# Patient Record
Sex: Female | Born: 1975
Health system: Southern US, Community
[De-identification: ages and names within clinical notes are randomized; demographics above are authoritative.]

## PROBLEM LIST (undated history)

## (undated) DIAGNOSIS — Z789 Other specified health status: Secondary | ICD-10-CM

## (undated) HISTORY — PX: CLOSED REDUCTION ULNAR SHAFT: SHX5009

---

## 1997-09-21 ENCOUNTER — Inpatient Hospital Stay (HOSPITAL_COMMUNITY): Admission: AD | Admit: 1997-09-21 | Discharge: 1997-09-21 | Payer: Self-pay | Admitting: *Deleted

## 1997-12-16 ENCOUNTER — Inpatient Hospital Stay (HOSPITAL_COMMUNITY): Admission: RE | Admit: 1997-12-16 | Discharge: 1997-12-16 | Payer: Self-pay | Admitting: Obstetrics

## 1997-12-20 ENCOUNTER — Inpatient Hospital Stay (HOSPITAL_COMMUNITY): Admission: AD | Admit: 1997-12-20 | Discharge: 1997-12-20 | Payer: Self-pay | Admitting: *Deleted

## 1997-12-23 ENCOUNTER — Encounter (HOSPITAL_COMMUNITY): Admission: RE | Admit: 1997-12-23 | Discharge: 1998-01-09 | Payer: Self-pay | Admitting: Obstetrics

## 1998-01-06 ENCOUNTER — Inpatient Hospital Stay (HOSPITAL_COMMUNITY): Admission: RE | Admit: 1998-01-06 | Discharge: 1998-01-09 | Payer: Self-pay | Admitting: *Deleted

## 1998-02-06 ENCOUNTER — Inpatient Hospital Stay (HOSPITAL_COMMUNITY): Admission: AD | Admit: 1998-02-06 | Discharge: 1998-02-06 | Payer: Self-pay | Admitting: Obstetrics & Gynecology

## 1998-06-01 ENCOUNTER — Encounter: Payer: Self-pay | Admitting: Emergency Medicine

## 1998-06-01 ENCOUNTER — Emergency Department (HOSPITAL_COMMUNITY): Admission: EM | Admit: 1998-06-01 | Discharge: 1998-06-01 | Payer: Self-pay | Admitting: Emergency Medicine

## 1999-07-29 ENCOUNTER — Inpatient Hospital Stay (HOSPITAL_COMMUNITY): Admission: AD | Admit: 1999-07-29 | Discharge: 1999-07-29 | Payer: Self-pay | Admitting: Obstetrics

## 1999-12-17 ENCOUNTER — Inpatient Hospital Stay (HOSPITAL_COMMUNITY): Admission: AD | Admit: 1999-12-17 | Discharge: 1999-12-17 | Payer: Self-pay | Admitting: Obstetrics

## 1999-12-17 ENCOUNTER — Encounter: Payer: Self-pay | Admitting: Obstetrics

## 2000-01-09 ENCOUNTER — Inpatient Hospital Stay (HOSPITAL_COMMUNITY): Admission: AD | Admit: 2000-01-09 | Discharge: 2000-01-11 | Payer: Self-pay | Admitting: Obstetrics

## 2000-01-24 ENCOUNTER — Inpatient Hospital Stay (HOSPITAL_COMMUNITY): Admission: AD | Admit: 2000-01-24 | Discharge: 2000-01-24 | Payer: Self-pay | Admitting: Obstetrics & Gynecology

## 2001-03-30 ENCOUNTER — Encounter: Payer: Self-pay | Admitting: Emergency Medicine

## 2001-03-30 ENCOUNTER — Emergency Department (HOSPITAL_COMMUNITY): Admission: EM | Admit: 2001-03-30 | Discharge: 2001-03-31 | Payer: Self-pay | Admitting: Emergency Medicine

## 2001-06-06 ENCOUNTER — Encounter: Payer: Self-pay | Admitting: *Deleted

## 2001-06-06 ENCOUNTER — Inpatient Hospital Stay (HOSPITAL_COMMUNITY): Admission: AD | Admit: 2001-06-06 | Discharge: 2001-06-06 | Payer: Self-pay | Admitting: *Deleted

## 2001-06-25 ENCOUNTER — Inpatient Hospital Stay (HOSPITAL_COMMUNITY): Admission: AD | Admit: 2001-06-25 | Discharge: 2001-07-01 | Payer: Self-pay | Admitting: Obstetrics & Gynecology

## 2001-06-25 ENCOUNTER — Encounter: Payer: Self-pay | Admitting: Obstetrics & Gynecology

## 2001-07-06 ENCOUNTER — Encounter (INDEPENDENT_AMBULATORY_CARE_PROVIDER_SITE_OTHER): Payer: Self-pay

## 2001-07-06 ENCOUNTER — Encounter: Payer: Self-pay | Admitting: Obstetrics & Gynecology

## 2001-07-06 ENCOUNTER — Inpatient Hospital Stay (HOSPITAL_COMMUNITY): Admission: AD | Admit: 2001-07-06 | Discharge: 2001-07-08 | Payer: Self-pay | Admitting: Obstetrics & Gynecology

## 2001-11-09 ENCOUNTER — Inpatient Hospital Stay (HOSPITAL_COMMUNITY): Admission: AD | Admit: 2001-11-09 | Discharge: 2001-11-09 | Payer: Self-pay | Admitting: Obstetrics and Gynecology

## 2001-12-29 ENCOUNTER — Emergency Department (HOSPITAL_COMMUNITY): Admission: EM | Admit: 2001-12-29 | Discharge: 2001-12-29 | Payer: Self-pay | Admitting: Emergency Medicine

## 2002-03-02 ENCOUNTER — Emergency Department (HOSPITAL_COMMUNITY): Admission: EM | Admit: 2002-03-02 | Discharge: 2002-03-03 | Payer: Self-pay | Admitting: Emergency Medicine

## 2002-03-16 ENCOUNTER — Encounter (INDEPENDENT_AMBULATORY_CARE_PROVIDER_SITE_OTHER): Payer: Self-pay | Admitting: Specialist

## 2002-03-16 ENCOUNTER — Encounter: Admission: RE | Admit: 2002-03-16 | Discharge: 2002-03-16 | Payer: Self-pay | Admitting: Internal Medicine

## 2002-03-25 ENCOUNTER — Ambulatory Visit (HOSPITAL_COMMUNITY): Admission: RE | Admit: 2002-03-25 | Discharge: 2002-03-25 | Payer: Self-pay | Admitting: Internal Medicine

## 2002-04-20 ENCOUNTER — Encounter (INDEPENDENT_AMBULATORY_CARE_PROVIDER_SITE_OTHER): Payer: Self-pay | Admitting: *Deleted

## 2002-04-20 ENCOUNTER — Other Ambulatory Visit: Admission: RE | Admit: 2002-04-20 | Discharge: 2002-04-20 | Payer: Self-pay | Admitting: *Deleted

## 2002-04-20 ENCOUNTER — Encounter: Admission: RE | Admit: 2002-04-20 | Discharge: 2002-04-20 | Payer: Self-pay | Admitting: *Deleted

## 2002-04-24 ENCOUNTER — Emergency Department (HOSPITAL_COMMUNITY): Admission: EM | Admit: 2002-04-24 | Discharge: 2002-04-24 | Payer: Self-pay | Admitting: Emergency Medicine

## 2002-05-04 ENCOUNTER — Encounter: Admission: RE | Admit: 2002-05-04 | Discharge: 2002-05-04 | Payer: Self-pay | Admitting: *Deleted

## 2003-11-14 ENCOUNTER — Emergency Department (HOSPITAL_COMMUNITY): Admission: EM | Admit: 2003-11-14 | Discharge: 2003-11-14 | Payer: Self-pay | Admitting: Emergency Medicine

## 2004-02-21 ENCOUNTER — Encounter: Admission: RE | Admit: 2004-02-21 | Discharge: 2004-02-21 | Payer: Self-pay | Admitting: Family Medicine

## 2007-04-05 ENCOUNTER — Emergency Department (HOSPITAL_COMMUNITY): Admission: EM | Admit: 2007-04-05 | Discharge: 2007-04-05 | Payer: Self-pay | Admitting: Family Medicine

## 2007-05-15 ENCOUNTER — Ambulatory Visit: Payer: Self-pay | Admitting: Internal Medicine

## 2007-10-01 ENCOUNTER — Encounter: Payer: Self-pay | Admitting: Obstetrics & Gynecology

## 2007-10-01 ENCOUNTER — Inpatient Hospital Stay (HOSPITAL_COMMUNITY): Admission: AD | Admit: 2007-10-01 | Discharge: 2007-10-03 | Payer: Self-pay | Admitting: Obstetrics & Gynecology

## 2007-10-01 ENCOUNTER — Ambulatory Visit: Payer: Self-pay | Admitting: *Deleted

## 2009-02-25 ENCOUNTER — Inpatient Hospital Stay (HOSPITAL_COMMUNITY): Admission: AD | Admit: 2009-02-25 | Discharge: 2009-02-26 | Payer: Self-pay | Admitting: Obstetrics & Gynecology

## 2009-02-25 ENCOUNTER — Ambulatory Visit: Payer: Self-pay | Admitting: Obstetrics and Gynecology

## 2009-03-12 ENCOUNTER — Ambulatory Visit: Payer: Self-pay | Admitting: Obstetrics and Gynecology

## 2009-03-12 ENCOUNTER — Inpatient Hospital Stay (HOSPITAL_COMMUNITY): Admission: AD | Admit: 2009-03-12 | Discharge: 2009-03-14 | Payer: Self-pay | Admitting: Obstetrics and Gynecology

## 2010-02-16 ENCOUNTER — Emergency Department (HOSPITAL_COMMUNITY): Admission: EM | Admit: 2010-02-16 | Discharge: 2010-02-16 | Payer: Self-pay | Admitting: Emergency Medicine

## 2010-09-14 LAB — DIFFERENTIAL
Basophils Relative: 0 % (ref 0–1)
Eosinophils Absolute: 0.1 10*3/uL (ref 0.0–0.7)
Lymphocytes Relative: 14 % (ref 12–46)
Monocytes Absolute: 1.1 10*3/uL — ABNORMAL HIGH (ref 0.1–1.0)
Neutrophils Relative %: 77 % (ref 43–77)

## 2010-09-14 LAB — CBC
HCT: 32.1 % — ABNORMAL LOW (ref 36.0–46.0)
Hemoglobin: 10.4 g/dL — ABNORMAL LOW (ref 12.0–15.0)
MCHC: 33.8 g/dL (ref 30.0–36.0)
MCV: 98.7 fL (ref 78.0–100.0)
Platelets: 191 10*3/uL (ref 150–400)
Platelets: 201 10*3/uL (ref 150–400)
RBC: 3.28 MIL/uL — ABNORMAL LOW (ref 3.87–5.11)
WBC: 14.3 10*3/uL — ABNORMAL HIGH (ref 4.0–10.5)
WBC: 8.7 10*3/uL (ref 4.0–10.5)

## 2010-09-14 LAB — RPR: RPR Ser Ql: NONREACTIVE

## 2010-09-14 LAB — COMPREHENSIVE METABOLIC PANEL
BUN: 6 mg/dL (ref 6–23)
Creatinine, Ser: 0.54 mg/dL (ref 0.4–1.2)
GFR calc Af Amer: >60 mL/min (ref >60)
Sodium: 134 mEq/L — ABNORMAL LOW (ref 135–145)
Total Bilirubin: 0.5 mg/dL (ref 0.3–1.2)

## 2010-09-14 LAB — GC/CHLAMYDIA PROBE AMP, GENITAL: GC Probe Amp, Genital: NEGATIVE

## 2010-09-14 LAB — RAPID URINE DRUG SCREEN, HOSP PERFORMED
Amphetamines: NOT DETECTED
Cocaine: NOT DETECTED
Cocaine: NOT DETECTED
Opiates: NOT DETECTED
Opiates: NOT DETECTED
Tetrahydrocannabinol: POSITIVE — AB

## 2010-09-14 LAB — TYPE AND SCREEN
ABO/RH(D): B POS
Antibody Screen: NEGATIVE

## 2010-09-14 LAB — STREP B DNA PROBE: Strep Group B Ag: NEGATIVE

## 2010-09-14 LAB — RUBELLA SCREEN: Rubella: 45.4 IU/mL — ABNORMAL HIGH

## 2010-09-14 LAB — WET PREP, GENITAL

## 2010-09-14 LAB — HEPATITIS B SURFACE ANTIGEN: Hepatitis B Surface Ag: NEGATIVE

## 2010-09-14 LAB — RAPID HIV SCREEN (WH-MAU): Rapid HIV Screen: NONREACTIVE

## 2010-10-26 NOTE — Discharge Summary (Signed)
[facility]  Patient:    Connie Martin, Connie Martin Visit Number: [id number] MRN: [id number]          Service Type: MED Location: [phone] Attending Physician:  Antionette Char Dictated by:   Ocie Doyne, M.D. Admit Date:  07/06/2001 Discharge Date: 07/08/2001                             Discharge Summary  DISCHARGE DIAGNOSES:          1. Cervicitis.                               2. Spontaneous abortion at [redacted] weeks gestation of                                  twin pregnancy.  DISCHARGE MEDICATIONS:        1. Tylenol 650 mg p.o. q.6h. p.r.n. pain.                               2. Ibuprofen 600 mg p.o. q.6h. p.r.n. pain.  HISTORY AND PHYSICAL:         This 35 year old G7, P3-1-2-4 at 52 2/[redacted] weeks gestation presented via EMS to maternity admissions.  She stated that her contractions had started five hours prior to admission and that she had bleeding from the vagina.  She had already delivered one fetus at home that morning and was continuing to have cramping.  She rated her pain an 8/10 on severity.  She had recently been treated at [facility] for cervicitis and received a week of IV antibiotics earlier in the month because of persistent unexplained vaginal bleeding.  On examination her vital signs were stable.  The fetal heart rate of the surviving twin was dopplered at 180. Generally, she was tearful.  The abdomen was soft and nontender.  Twin A remained attached by the umbilical cord which was clamped and cut and twin A was sent to pathology for microscopic examination.  Sterile vaginal examination revealed cervix 4 cm dilated.  A review of the patients medical records revealed that an ultrasound on June 25, 2001 showed a twin gestation with separate amniotic sacs.  However, a bed side ultrasound prior to admission revealed that there was no fluid surrounding twin B.  HOSPITAL COURSE:              Connie Martin was admitted to the birthing  suite and immediately started on Unasyn IV for the treatment of presumptive cervicitis. The hospital chaplain was involved to provide emotional support to the family and a detailed discussion was held with Connie Martin and her husband involving the likely poor outcome for twin B given that the membranes appeared to have ruptured.  On hospital day #2 the patient requested an ultrasound to evaluate fetal cardiac activity which again confirmed that there was no amniotic fluid. At that point the patient stated that given the likelihood of a poor prognosis she preferred to proceed with augmentation of labor using Pitocin.  She delivered twin B later that evening.  The products of conception appeared intact.  The amniotic fluid in the sac was yellow and malodorous with a purulent appearance.  She was continued on Unasyn for an additional 24 hours.  The following day she had scant lochia with mild fundal tenderness and her maximum temperature was 99.8.  She was observed for 24 hours following delivery and then was discharged home.  At the time of discharge she was requesting to have a tubal ligation for permanent sterilization.  Referrals were made for grief counseling and she was given contact information for the GYN Clinic at [facility] to pursue permanent sterilization in the future if she so desires. Dictated by:   Ocie Doyne, M.D. Attending Physician:  Antionette Char DD:  08/05/01 TD:  08/05/01 Job: [id number]

## 2010-10-26 NOTE — Discharge Summary (Signed)
[facility]  Patient:    Connie Martin, Connie Martin Visit Number: [id number] MRN: [id number]          Service Type: MED Location: [phone] Attending Physician:  Antionette Char Dictated by:   Ed Blalock. Burnadette Peter, M.D. Admit Date:  06/25/2001 Discharge Date: 07/01/2001                             Discharge Summary  DATE OF BIRTH:                September 25, 1975  HISTORY OF PRESENT ILLNESS:   This 35 year old, G7, P3-1-2-4, presented with the chief complaint of abdominal cramping and vaginal bleeding which started the day prior to admission.  The patient had an unknown LMP, but ultrasound in maternity admissions unit showed a gestational age of [redacted] weeks 5 days, with twin gestation.  The patient had vaginal bleeding and yellow discharge with a bad odor and negative HPV diagnosis recently negative for GC and chlamydia.  MEDICATIONS:                  Prenatal vitamins.  ALLERGIES:                    No known drug allergies.  OBSTETRICAL HISTORY:          1. Spontaneous vaginal delivery x 4.                               2. History of PIH.                               3. History of PID with last pregnancy.  GYNECOLOGICAL HISTORY:        1. Complete AB x 2.                               2. History of HSV.                               3. GC and chlamydia.                               4. PID.                               5. Normal Pap at [facility] recently.  PAST MEDICAL HISTORY:         Denies any.  PAST SURGICAL HISTORY:        Surgery on right ear.  HOSPITALIZATIONS:             She had one with a right foot repair.  SOCIAL HISTORY:               She smokes two packs a day.  She denies alcohol use.  She uses marijuana intermittently on the weekends.  One partner ______ years.  No contact since her last visit to MAU.  PHYSICAL EXAMINATION:  VITAL SIGNS:                  Temperature 97.6, heart rate 96, respirations 20,  blood pressure  121/67.  GENERAL:                      Well-developed, well-nourished African-American female.  Comfortable, in no acute distress.  ABDOMEN:                      Soft.  Mildly tender.  PELVIC:                       Normal external genitalia with blood noted. Cervix was closed.  Adnexa soft and slightly tender bilaterally.  She had a small amount of odorous discharge.  Uterus was gravid.  There were fetal heart tones noted.  LABORATORY DATA:              Wet prep showed few epithelials, few wbcs, and moderate bacteria.  CBC showed hemoglobin and hematocrit of 11 and 31.6, white blood cell count 12.4.  Ultrasound showed twin gestation with a divided membrane, 15 weeks 5 days. Cervix was 3 cm, long.  HOSPITAL COURSE:              The patient was admitted for IV antibiotics and bed rest.  She continued to experience vaginal bleeding throughout the hospitalization.  After hospital day #4 this stabilized and began to decrease. On hospital day #5 it decreased significantly and changed to a pinkish discharge.  On the day of discharge she reported little or no discharge.  All of this occurred while the patient was on cephaloram 2 g IV q.12h. over a period of seven days.  During this time she had no other difficulties.  Had good normal heart tones with Doppler and was nontender in the abdomen.  DIET:                         Regular.  ACTIVITY:                     Bed rest.  DISCHARGE MEDICATIONS:        Prenatal vitamins 1 p.o. q.d.  FOLLOW-UP:                    She has an appointment at the high-risk clinic at 8:45 on July 09, 2001.  CONDITION ON DISCHARGE:       Improved.  DISCHARGE DIAGNOSES:          1. Intrauterine pregnancy with twin gestation at                                  16-4/7 weeks at discharge.                               2. Vaginal bleeding, resolved.                               3. Bacterial vaginosis, treated. Dictated by:   Ed Blalock. Burnadette Peter,  M.D. Attending Physician:  Antionette Char DD:  07/01/01 TD:  07/02/01 Job: [id number]

## 2010-10-26 NOTE — H&P (Signed)
[facility]  Patient:    Connie Martin, Connie Martin                      MRN: [id number] Adm. Date:  [id number] Disc. Date: [id number] Attending:  Antionette Char                         History and Physical  REASON FOR ADMISSION:         A 35 year old African-American female presented to [facility] with uterine contractions, no prenatal care.  By her last menstrual period, she had a due date of January 21, 2000.  The patient was having strong uterine contractions on admission through maternity admissions unit and precipitously delivered a viable infant at 64 on January 09, 2000 in the maternity admissions unit.  Apgars were 9 at one minute, 9 at five minutes.  The placenta was delivered spontaneously intact at 1334.  There were no lacerations observed.  PAST MEDICAL HISTORY:  SURGERY:                      Broken arm as a child.  ILLNESSES:                    None.  MEDICATIONS:                  None.  ALLERGIES:                    No known drug allergies.  SOCIAL HISTORY:               Single.  Smokes two packs of cigarettes per day.  Positive marijuana.  OBSTETRICAL HISTORY:          No prenatal care for this pregnancy.  Had a spontaneous abortion in 1994, normal spontaneous vaginal delivery in 1995, preterm delivery at 28 weeks in 1996, spontaneous abortion in 1999, normal spontaneous vaginal delivery in 1999.  GYNECOLOGICAL HISTORY:        Positive history of abnormal Pap smear. Negative history of sexually-transmitted diseases.  PHYSICAL EXAMINATION:  VITAL SIGNS:                  Afebrile, vital signs stable.  ABDOMEN:                      Gravid.  Uterus nontender.  PELVIC:                       Cervix fully dilated, 100% effaced, and the presenting part was vertex at a +2 to +3 station.  IMPRESSION:                   Active labor with impending precipitous vaginal delivery.  PLAN:                         Routine postpartum  care.  HOSPITAL COURSE:              Patient precipitously delivered a viable infant with Apgars of 9 at one minute and 9 at five minutes, and a weight of 2300 g. There were no intrapartum complications.  DISPOSITION:                  Will obtain routine prenatal labs, routine postpartum care, will get social service consultation because of the  history of no prenatal care and the history of substance abuse. DD:  02/20/00 TD:  02/20/00 Job: [id number]

## 2010-12-03 ENCOUNTER — Emergency Department (HOSPITAL_COMMUNITY)
Admission: EM | Admit: 2010-12-03 | Discharge: 2010-12-03 | Disposition: A | Discharge status: Home / Self Care | Payer: Medicaid Other | Attending: Emergency Medicine | Admitting: Emergency Medicine

## 2010-12-03 DIAGNOSIS — K089 Disorder of teeth and supporting structures, unspecified: Secondary | ICD-10-CM | POA: Insufficient documentation

## 2011-03-05 LAB — RPR: RPR Ser Ql: NONREACTIVE

## 2011-03-05 LAB — DIFFERENTIAL
Lymphocytes Relative: 20
Lymphs Abs: 2.3
Neutro Abs: 7.8 — ABNORMAL HIGH
Neutrophils Relative %: 70

## 2011-03-05 LAB — RAPID URINE DRUG SCREEN, HOSP PERFORMED
Barbiturates: NOT DETECTED
Cocaine: NOT DETECTED
Opiates: NOT DETECTED
Tetrahydrocannabinol: POSITIVE — AB

## 2011-03-05 LAB — CBC
RDW: 13.1
WBC: 11.1 — ABNORMAL HIGH

## 2011-03-05 LAB — RAPID HIV SCREEN (WH-MAU): Rapid HIV Screen: NONREACTIVE

## 2011-03-05 LAB — TYPE AND SCREEN

## 2012-03-09 ENCOUNTER — Encounter (HOSPITAL_COMMUNITY): Payer: Self-pay | Admitting: *Deleted

## 2012-03-09 ENCOUNTER — Emergency Department (HOSPITAL_COMMUNITY)
Admission: EM | Admit: 2012-03-09 | Discharge: 2012-03-09 | Disposition: A | Discharge status: Home / Self Care | Payer: Self-pay | Attending: Emergency Medicine | Admitting: Emergency Medicine

## 2012-03-09 DIAGNOSIS — R21 Rash and other nonspecific skin eruption: Secondary | ICD-10-CM | POA: Insufficient documentation

## 2012-03-09 DIAGNOSIS — F17200 Nicotine dependence, unspecified, uncomplicated: Secondary | ICD-10-CM | POA: Insufficient documentation

## 2012-03-09 LAB — POCT I-STAT, CHEM 8
Calcium, Ion: 1.19 mmol/L (ref 1.12–1.23)
Creatinine, Ser: 0.7 mg/dL (ref 0.50–1.10)
Glucose, Bld: 98 mg/dL (ref 70–99)
HCT: 33 % — ABNORMAL LOW (ref 36.0–46.0)
Hemoglobin: 11.2 g/dL — ABNORMAL LOW (ref 12.0–15.0)
TCO2: 23 mmol/L (ref 0–100)

## 2012-03-09 LAB — CBC WITH DIFFERENTIAL/PLATELET
Basophils Absolute: 0 10*3/uL (ref 0.0–0.1)
Basophils Relative: 0 % (ref 0–1)
Eosinophils Absolute: 0.1 10*3/uL (ref 0.0–0.7)
Eosinophils Relative: 1 % (ref 0–5)
HCT: 31.1 % — ABNORMAL LOW (ref 36.0–46.0)
Hemoglobin: 11 g/dL — ABNORMAL LOW (ref 12.0–15.0)
Lymphocytes Relative: 27 % (ref 12–46)
Lymphs Abs: 2.1 10*3/uL (ref 0.7–4.0)
MCH: 32.3 pg (ref 26.0–34.0)
MCHC: 35.4 g/dL (ref 30.0–36.0)
MCV: 91.2 fL (ref 78.0–100.0)
Monocytes Absolute: 0.7 10*3/uL (ref 0.1–1.0)
Monocytes Relative: 9 % (ref 3–12)
Neutro Abs: 5.1 10*3/uL (ref 1.7–7.7)
Neutrophils Relative %: 63 % (ref 43–77)
Platelets: 196 10*3/uL (ref 150–400)
RBC: 3.41 MIL/uL — ABNORMAL LOW (ref 3.87–5.11)
RDW: 12.3 % (ref 11.5–15.5)
WBC: 8 10*3/uL (ref 4.0–10.5)

## 2012-03-09 LAB — URINALYSIS, ROUTINE W REFLEX MICROSCOPIC
Bilirubin Urine: NEGATIVE
Glucose, UA: NEGATIVE mg/dL
Hgb urine dipstick: NEGATIVE
Ketones, ur: NEGATIVE mg/dL
Leukocytes, UA: NEGATIVE
Nitrite: NEGATIVE
Protein, ur: NEGATIVE mg/dL
Specific Gravity, Urine: 1.028 (ref 1.005–1.030)
Urobilinogen, UA: 1 mg/dL (ref 0.0–1.0)
pH: 6 (ref 5.0–8.0)

## 2012-03-09 LAB — POCT PREGNANCY, URINE: Preg Test, Ur: POSITIVE — AB

## 2012-03-09 NOTE — ED Notes (Signed)
PT to ED c/o butterly-wing shaped rash on her face and macular rash with her hair falling out x 1 week.  Pt states the last time she had the rash, she was pregnant, but this time is different, b/c she is breaking out all over.   Also c/o of recent fatigue.

## 2012-03-09 NOTE — ED Provider Notes (Signed)
History     CSN: [id number]  Arrival date & time 03/09/12  2010   First MD Initiated Contact with Patient 03/09/12 2139      Chief Complaint  Patient presents with  . Rash    (Consider location/radiation/quality/duration/timing/severity/associated sxs/prior treatment) HPI Comments: Pt had come in with a rash on her face and with a history of hair falling out for a week.  When I saw her she was very upset because she had had a pregnancy test that was positive.  She refused to tell me her history, and left without full exam and did not receive treatment.  Patient is a 36 y.o. female presenting with rash. The history is provided by the patient and a friend. No language interpreter was used.  Rash  This is a new problem. Episode onset: Appx 1 week, per triage note. Associated with: Hair falling out. The rash is present on the face. The pain is at a severity of 8/10. She has tried nothing for the symptoms.    History reviewed. No pertinent past medical history.  Past Surgical History  Procedure Date  . Closed reduction ulnar shaft     No family history on file.  History  Substance Use Topics  . Smoking status: Current Some Day Smoker -- 2.0 packs/day    Types: Cigarettes  . Smokeless tobacco: Not on file  . Alcohol Use: No    OB History    Grav Para Term Preterm Abortions TAB SAB Ect Mult Living                  Review of Systems  Unable to perform ROS Skin: Positive for rash.    Allergies  Review of patient's allergies indicates no known allergies.  Home Medications  No current outpatient prescriptions on file.  BP 129/90  Pulse 108  Temp 98.6 F (37 C) (Oral)  Resp 18  SpO2 99%  Physical Exam  Nursing note and vitals reviewed. Constitutional: She appears well-developed and well-nourished.       Upset and angry.    HENT:       Has hyperpigmented rash on both cheeks and bridge of nose.    ED Course  Procedures (including critical care time)  Labs  Reviewed  CBC WITH DIFFERENTIAL - Abnormal; Notable for the following:    RBC 3.41 (*)     Hemoglobin 11.0 (*)     HCT 31.1 (*)     All other components within normal limits  URINALYSIS, ROUTINE W REFLEX MICROSCOPIC - Abnormal; Notable for the following:    APPearance CLOUDY (*)     All other components within normal limits  POCT I-STAT, CHEM 8 - Abnormal; Notable for the following:    Potassium 2.9 (*)     Hemoglobin 11.2 (*)     HCT 33.0 (*)     All other components within normal limits  POCT PREGNANCY, URINE - Abnormal; Notable for the following:    Preg Test, Ur POSITIVE (*)     All other components within normal limits   DISP:  I told pt I would get her lab results and review them with her.  By the time I got back to the room, she had walked out.  Her facial rash made me think of lupus erythematosis.    1. Rash of face             Carleene Cooper III, MD 03/09/12 2201

## 2012-06-10 NOTE — L&D Delivery Note (Signed)
Connie Martin is a 37 y.o. [id number] at [redacted]w[redacted]d gestation by today's ultrasound with no prenatal care. She presented today with abdominal cramping and diarrhea and was found to be 5.5 cm dilated. FHTs were reassuring. She progressed to 10 cm in MAU despite minimal discomfort and irregular activity on tocometry. She was taken to L&D where she delivered shortly thereafter. NICU was called and was present for delivery.  Delivery Note At 1:59 PM a viable female was delivered via Vaginal, Spontaneous Delivery (Presentation: Left Occiput Anterior).  APGAR: 8, 9; weight 5 lb, 3.8 oz. Placenta status: Intact, Spontaneous.  Cord:  3 vessel, short.  Cord pH: n/a.  Complications: none  Anesthesia: Local  Episiotomy: None Lacerations: None Suture Repair: n/a Est. Blood Loss (mL): 200  Mom to postpartum.  Baby to nursery-stable.  Napoleon Form 07/05/2012, 2:23 PM

## 2012-06-10 NOTE — L&D Delivery Note (Signed)
I have reviewed chart and agree with the plan of care.  HARRAWAY-SMITH, Tabathia Knoche, M.D, FACOG    

## 2012-07-05 ENCOUNTER — Inpatient Hospital Stay (HOSPITAL_COMMUNITY)
Admission: AD | Admit: 2012-07-05 | Discharge: 2012-07-07 | DRG: 775 | Disposition: A | Discharge status: Home / Self Care | Payer: Medicaid Other | Source: Ambulatory Visit | Attending: Obstetrics & Gynecology | Admitting: Obstetrics & Gynecology

## 2012-07-05 ENCOUNTER — Encounter (HOSPITAL_COMMUNITY): Payer: Self-pay | Admitting: Obstetrics and Gynecology

## 2012-07-05 ENCOUNTER — Inpatient Hospital Stay (HOSPITAL_COMMUNITY): Payer: Medicaid Other

## 2012-07-05 DIAGNOSIS — O368190 Decreased fetal movements, unspecified trimester, not applicable or unspecified: Principal | ICD-10-CM | POA: Diagnosis present

## 2012-07-05 DIAGNOSIS — O0930 Supervision of pregnancy with insufficient antenatal care, unspecified trimester: Secondary | ICD-10-CM

## 2012-07-05 HISTORY — DX: Other specified health status: Z78.9

## 2012-07-05 LAB — GROUP B STREP BY PCR: Group B strep by PCR: INVALID — AB

## 2012-07-05 LAB — URINE MICROSCOPIC-ADD ON

## 2012-07-05 LAB — CBC WITH DIFFERENTIAL/PLATELET
Basophils Relative: 0 % (ref 0–1)
Eosinophils Absolute: 0 10*3/uL (ref 0.0–0.7)
HCT: 33.7 % — ABNORMAL LOW (ref 36.0–46.0)
Hemoglobin: 11.6 g/dL — ABNORMAL LOW (ref 12.0–15.0)
Lymphs Abs: 1.5 10*3/uL (ref 0.7–4.0)
MCH: 32.3 pg (ref 26.0–34.0)
MCHC: 34.4 g/dL (ref 30.0–36.0)
Monocytes Absolute: 0.9 10*3/uL (ref 0.1–1.0)
Monocytes Relative: 6 % (ref 3–12)
Neutro Abs: 11.9 10*3/uL — ABNORMAL HIGH (ref 1.7–7.7)
Neutrophils Relative %: 83 % — ABNORMAL HIGH (ref 43–77)
RBC: 3.59 MIL/uL — ABNORMAL LOW (ref 3.87–5.11)

## 2012-07-05 LAB — URINALYSIS, ROUTINE W REFLEX MICROSCOPIC
Bilirubin Urine: NEGATIVE
Glucose, UA: NEGATIVE mg/dL
Protein, ur: NEGATIVE mg/dL
Urobilinogen, UA: 0.2 mg/dL (ref 0.0–1.0)

## 2012-07-05 LAB — TYPE AND SCREEN: ABO/RH(D): B POS

## 2012-07-05 LAB — RAPID HIV SCREEN (WH-MAU): Rapid HIV Screen: NONREACTIVE

## 2012-07-05 LAB — AMNISURE RUPTURE OF MEMBRANE (ROM) NOT AT ARMC: Amnisure ROM: NEGATIVE

## 2012-07-05 MED ORDER — NALBUPHINE SYRINGE 5 MG/0.5 ML: 5.0000 mg | INJECTION | INTRAMUSCULAR | Status: DC | PRN

## 2012-07-05 MED ORDER — TETANUS-DIPHTH-ACELL PERTUSSIS 5-2.5-18.5 LF-MCG/0.5 IM SUSP
0.5000 mL | Freq: Once | INTRAMUSCULAR | Status: AC
  Administered 2012-07-06: 0.5 mL via INTRAMUSCULAR

## 2012-07-05 MED ORDER — PNEUMOCOCCAL VAC POLYVALENT 25 MCG/0.5ML IJ INJ
0.5000 mL | INJECTION | INTRAMUSCULAR | Status: AC
  Administered 2012-07-06: 0.5 mL via INTRAMUSCULAR
  Filled 2012-07-05: qty 0.5

## 2012-07-05 MED ORDER — LIDOCAINE HCL (PF) 1 % IJ SOLN
30.0000 mL | INTRAMUSCULAR | Status: DC | PRN
  Administered 2012-07-05: 30 mL via SUBCUTANEOUS

## 2012-07-05 MED ORDER — SENNOSIDES-DOCUSATE SODIUM 8.6-50 MG PO TABS
2.0000 | ORAL_TABLET | Freq: Every day | ORAL | Status: DC
  Administered 2012-07-06: 2 via ORAL

## 2012-07-05 MED ORDER — CITRIC ACID-SODIUM CITRATE 334-500 MG/5ML PO SOLN: 30.0000 mL | ORAL | Status: DC | PRN

## 2012-07-05 MED ORDER — DIPHENHYDRAMINE HCL 25 MG PO CAPS: 25.0000 mg | ORAL_CAPSULE | Freq: Four times a day (QID) | ORAL | Status: DC | PRN

## 2012-07-05 MED ORDER — WITCH HAZEL-GLYCERIN EX PADS: 1.0000 "application " | MEDICATED_PAD | CUTANEOUS | Status: DC | PRN

## 2012-07-05 MED ORDER — ONDANSETRON HCL 4 MG/2ML IJ SOLN: 4.0000 mg | Freq: Four times a day (QID) | INTRAMUSCULAR | Status: DC | PRN

## 2012-07-05 MED ORDER — SODIUM CHLORIDE 0.9 % IV SOLN
2.0000 g | Freq: Four times a day (QID) | INTRAVENOUS | Status: DC
  Filled 2012-07-05 (×3): qty 2000

## 2012-07-05 MED ORDER — OXYTOCIN BOLUS FROM INFUSION: 500.0000 mL | INTRAVENOUS | Status: DC

## 2012-07-05 MED ORDER — ACETAMINOPHEN 325 MG PO TABS: 650.0000 mg | ORAL_TABLET | ORAL | Status: DC | PRN

## 2012-07-05 MED ORDER — ONDANSETRON HCL 4 MG/2ML IJ SOLN: 4.0000 mg | INTRAMUSCULAR | Status: DC | PRN

## 2012-07-05 MED ORDER — LANOLIN HYDROUS EX OINT: TOPICAL_OINTMENT | CUTANEOUS | Status: DC | PRN

## 2012-07-05 MED ORDER — INFLUENZA VIRUS VACC SPLIT PF IM SUSP
0.5000 mL | INTRAMUSCULAR | Status: AC
  Administered 2012-07-06: 0.5 mL via INTRAMUSCULAR
  Filled 2012-07-05: qty 0.5

## 2012-07-05 MED ORDER — LIDOCAINE HCL (PF) 1 % IJ SOLN
INTRAMUSCULAR | Status: AC
  Filled 2012-07-05: qty 30

## 2012-07-05 MED ORDER — PRENATAL MULTIVITAMIN CH
1.0000 | ORAL_TABLET | Freq: Every day | ORAL | Status: DC
  Administered 2012-07-06 – 2012-07-07 (×2): 1 via ORAL
  Filled 2012-07-05 (×2): qty 1

## 2012-07-05 MED ORDER — FLEET ENEMA 7-19 GM/118ML RE ENEM: 1.0000 | ENEMA | RECTAL | Status: DC | PRN

## 2012-07-05 MED ORDER — LACTATED RINGERS IV SOLN: 500.0000 mL | INTRAVENOUS | Status: DC | PRN

## 2012-07-05 MED ORDER — HYDROXYZINE HCL 50 MG/ML IM SOLN: 50.0000 mg | Freq: Four times a day (QID) | INTRAMUSCULAR | Status: DC | PRN

## 2012-07-05 MED ORDER — NALBUPHINE SYRINGE 5 MG/0.5 ML
5.0000 mg | INJECTION | Freq: Once | INTRAMUSCULAR | Status: AC
  Administered 2012-07-05: 5 mg via INTRAMUSCULAR
  Filled 2012-07-05: qty 0.5

## 2012-07-05 MED ORDER — IBUPROFEN 600 MG PO TABS
600.0000 mg | ORAL_TABLET | Freq: Four times a day (QID) | ORAL | Status: DC | PRN
  Administered 2012-07-05: 600 mg via ORAL
  Filled 2012-07-05: qty 1

## 2012-07-05 MED ORDER — OXYCODONE-ACETAMINOPHEN 5-325 MG PO TABS
1.0000 | ORAL_TABLET | ORAL | Status: DC | PRN
  Administered 2012-07-05: 1 via ORAL
  Filled 2012-07-05: qty 1

## 2012-07-05 MED ORDER — LACTATED RINGERS IV SOLN
INTRAVENOUS | Status: DC
  Administered 2012-07-05 (×2): via INTRAVENOUS

## 2012-07-05 MED ORDER — OXYCODONE-ACETAMINOPHEN 5-325 MG PO TABS
1.0000 | ORAL_TABLET | ORAL | Status: DC | PRN
  Administered 2012-07-06 (×2): 1 via ORAL
  Filled 2012-07-05 (×2): qty 1

## 2012-07-05 MED ORDER — OXYTOCIN 40 UNITS IN LACTATED RINGERS INFUSION - SIMPLE MED: 62.5000 mL/h | INTRAVENOUS | Status: DC

## 2012-07-05 MED ORDER — HYDROXYZINE HCL 50 MG PO TABS: 50.0000 mg | ORAL_TABLET | Freq: Four times a day (QID) | ORAL | Status: DC | PRN

## 2012-07-05 MED ORDER — OXYTOCIN 40 UNITS IN LACTATED RINGERS INFUSION - SIMPLE MED
INTRAVENOUS | Status: AC
  Filled 2012-07-05: qty 1000

## 2012-07-05 MED ORDER — SIMETHICONE 80 MG PO CHEW: 80.0000 mg | CHEWABLE_TABLET | ORAL | Status: DC | PRN

## 2012-07-05 MED ORDER — DIBUCAINE 1 % RE OINT: 1.0000 "application " | TOPICAL_OINTMENT | RECTAL | Status: DC | PRN

## 2012-07-05 MED ORDER — IBUPROFEN 600 MG PO TABS
600.0000 mg | ORAL_TABLET | Freq: Four times a day (QID) | ORAL | Status: DC
  Administered 2012-07-06 – 2012-07-07 (×7): 600 mg via ORAL
  Filled 2012-07-05 (×7): qty 1

## 2012-07-05 MED ORDER — BENZOCAINE-MENTHOL 20-0.5 % EX AERO
1.0000 "application " | INHALATION_SPRAY | CUTANEOUS | Status: DC | PRN
  Filled 2012-07-05: qty 56

## 2012-07-05 MED ORDER — ZOLPIDEM TARTRATE 5 MG PO TABS: 5.0000 mg | ORAL_TABLET | Freq: Every evening | ORAL | Status: DC | PRN

## 2012-07-05 MED ORDER — ONDANSETRON HCL 4 MG PO TABS: 4.0000 mg | ORAL_TABLET | ORAL | Status: DC | PRN

## 2012-07-05 MED ORDER — ACETAMINOPHEN 500 MG PO TABS
1000.0000 mg | ORAL_TABLET | Freq: Once | ORAL | Status: AC
  Administered 2012-07-05: 1000 mg via ORAL

## 2012-07-05 MED ORDER — ONDANSETRON 4 MG PO TBDP
4.0000 mg | ORAL_TABLET | Freq: Once | ORAL | Status: AC
  Administered 2012-07-05: 4 mg via ORAL
  Filled 2012-07-05: qty 1

## 2012-07-05 NOTE — MAU Note (Signed)
LMP 7 months ago. She thinks she is pregnant because she is feeling movement. She has been having diarrhea since 0500 this morning. Here for pain in her abdomen and back says "its real bad".

## 2012-07-05 NOTE — H&P (Signed)
Connie Martin is a 37 y.o. female presenting for active labor.   37 y.o. [id number] at unknown gestation and no PNC presenting to MAU with nausea and diarrhea (x 2 episodes) since 5:30 AM. Lower abdominal pain started about 2 hours ago, comes and goes, crampy like period. No fever/chills (feels cold). Didn't know pregnant until Sept-Oct at hospital visit. Did not believe it until her stomach started growing. Last period 4 or 5 months ago, not sure. Recent contact with child with flu and another with URI. No LOF or vaginal bleeding. Baby not moving as much as usual.  Hx of 2 preterm deliveries. Miscarriage twins - at 4 or 5 months. Had preterm delivery at close to 7 months with PPROM.    Maternal Medical History:  Reason for admission: Reason for admission: contractions and nausea.  Contractions: Onset was 3-5 hours ago.   Frequency: irregular.   Perceived severity is mild.    Fetal activity: Perceived fetal activity is decreased.   Last perceived fetal movement was within the past hour.    Prenatal complications: No prenatal care  Prenatal Complications - Diabetes: none.    OB History    Grav Para Term Preterm Abortions TAB SAB Ect Mult Living   9 7 5 2 1  1  1 6      Past Medical History  Diagnosis Date  . No pertinent past medical history    Past Surgical History  Procedure Date  . Closed reduction ulnar shaft    Family History: family history is not on file. Social History:  reports that she smokes about 2 packs of cigarettes per day. She has never used smokeless tobacco. She reports that she uses illicit drugs (Marijuana). She reports that she does not drink alcohol.   Prenatal Transfer Tool  Maternal Diabetes: No Genetic Screening: Declined Maternal Ultrasounds/Referrals: Declined Fetal Ultrasounds or other Referrals:  None Maternal Substance Abuse:  Yes:  Type: Marijuana Significant Maternal Medications:  None Significant Maternal Lab Results:  Lab values include:  Other: rapid GBS pending Other Comments:  No prenatal care. Only Ultrasound from today, [redacted]w[redacted]d. Labs pending.  Review of Systems  Constitutional: Negative for fever and chills.  Eyes: Negative for blurred vision and double vision.  Respiratory: Negative for shortness of breath.   Cardiovascular: Negative for chest pain.  Gastrointestinal: Positive for heartburn, nausea, abdominal pain and diarrhea. Negative for vomiting and constipation.  Genitourinary: Negative for dysuria and urgency.  Musculoskeletal: Negative for back pain.  Neurological: Positive for headaches (mild). Negative for dizziness.    Dilation: 10 Effacement (%): 100 Station: -3 Exam by:: Dr. Thad Ranger  Blood pressure 127/79, pulse 88, temperature 97.8 F (36.6 C), temperature source Oral, resp. rate 18, weight 80.74 kg (178 lb). Maternal Exam:  Uterine Assessment: Contraction frequency is irregular.   Abdomen: Patient reports the following abdominal tenderness: LLQ and RLQ.  Fetal presentation: vertex  Introitus: Ferning test: not done.   Pelvis: adequate for delivery.   Cervix: Cervix evaluated by digital exam.     Fetal Exam Fetal Monitor Review: Mode: ultrasound.   Baseline rate: 150.  Variability: moderate (6-25 bpm).   Pattern: accelerations present and no decelerations.    Fetal State Assessment: Category I - tracings are normal.     Physical Exam  Constitutional: She is oriented to person, place, and time. She appears well-developed and well-nourished. No distress.  HENT:  Head: Normocephalic and atraumatic.  Eyes: Conjunctivae normal and EOM are normal.  Neck: Normal range of  motion. Neck supple.  Cardiovascular: Normal rate, regular rhythm and normal heart sounds.   Respiratory: Effort normal and breath sounds normal. No respiratory distress.  GI: Soft. Bowel sounds are normal. There is tenderness in the right lower quadrant and left lower quadrant. There is no rebound and no guarding.    Musculoskeletal: Normal range of motion. She exhibits no edema and no tenderness.  Neurological: She is alert and oriented to person, place, and time.  Skin: Skin is warm and dry.  Psychiatric: She has a normal mood and affect.    Cervix evaluated by RN:  5.5 cm/90% effaced initially Reevaluated one hour later, 10/100/+1  Prenatal labs: ABO, Rh: --/--/B POS (01/26 1221) Antibody: NEG (01/26 1221) Rubella:   RPR:    HBsAg:    HIV:    GBS:     Assessment/Plan: 37 y.o. [id number] at [redacted]w[redacted]d at [redacted]w[redacted]d by today's ultrasound with no prenatal care in active labor - Admit to L&D - NICU called for preterm delivery - Anticipate SVD - UDS, SW consult  Napoleon Form 07/05/2012, 2:11 PM

## 2012-07-05 NOTE — H&P (Signed)
I have reviewed chart and agree with the plan of care.  HARRAWAY-SMITH, Anala Whisenant, M.D, FACOG    

## 2012-07-05 NOTE — MAU Note (Signed)
Pt presents to MAU with ? Pregnancy, says she has been feeling movement in her belly and its growing. She started having Nausea and diarrhea last night. She is requesting a c-section

## 2012-07-06 LAB — CBC
HCT: 29.5 % — ABNORMAL LOW (ref 36.0–46.0)
Hemoglobin: 10 g/dL — ABNORMAL LOW (ref 12.0–15.0)
WBC: 10.4 10*3/uL (ref 4.0–10.5)

## 2012-07-06 LAB — RPR: RPR Ser Ql: NONREACTIVE

## 2012-07-06 LAB — RUBELLA SCREEN: Rubella: 2.67 Index — ABNORMAL HIGH (ref <0.90)

## 2012-07-06 NOTE — Progress Notes (Signed)
Ur chart review completed.  

## 2012-07-06 NOTE — Discharge Summary (Signed)
Obstetric Discharge Summary Reason for Admission: Preterm labor at 35 weeks by [place], no prenatal care Prenatal Procedures: none Intrapartum Procedures: spontaneous vaginal delivery  Spontaneous Delivery (Presentation: Left Occiput Anterior).  Placenta status: Intact, Spontaneous. Cord: 3 vessel, short. Cord pH: n/a. Complications: none Postpartum Procedures: none Complications-Operative and Postpartum: none Hemoglobin  Date Value Range Status  07/06/2012 10.0* 12.0 - 15.0 g/dL Final     HCT  Date Value Range Status  07/06/2012 29.5* 36.0 - 46.0 % Final    Physical Exam:  General: alert, cooperative, appears stated age and no distress Lochia: appropriate Uterine Fundus: firm DVT Evaluation: No evidence of DVT seen on physical exam. Negative Homan's sign. No cords or calf tenderness. No significant calf/ankle edema.  Discharge Diagnoses: preterm delivery at 35wks  Discharge Information: Date: 07/06/2012 Activity: pelvic rest Diet: routine Medications: Ibuprofen Condition: stable Instructions: refer to practice specific booklet Discharge to: home Follow-up Information    Follow up with [facility], CAROLYN, MD. In 6 weeks.   Contact information:   [address] [place] [address] [phone]          Newborn Data: Live born female  Birth Weight: 5 lb 3.8 oz (2376 g) APGAR: 8, 9  Home with mother  Evalee Mutton 07/06/2012, 7:18 AM  I have seen the patient with the resident and agree with the above.   Agree with above note.  Tahir Blank H. 07/12/2012 1:30 PM

## 2012-07-06 NOTE — Clinical Social Work Maternal (Signed)
Clinical Social Work Department PSYCHOSOCIAL ASSESSMENT - MATERNAL/CHILD 07/06/2012  Patient:  Connie Martin,Connie Martin  Account Number:  [id number]  Admit Date:  07/05/2012  Childs Name:   Connie Martin    Clinical Social Worker:  Marrion Accomando, LCSW   Date/Time:  07/06/2012 10:00 AM  Date Referred:  07/06/2012   Referral source  CN     Referred reason  LPNC   Other referral source:    I:  FAMILY / HOME ENVIRONMENT Child's legal guardian:  PARENT  Guardian - Name Guardian - Age Guardian - Address  Lyndzie Garrelts [address], [place],  [address]  FOB not involved     Other household support members/support persons Name Relationship DOB   DAUGHTER 18    17    14    12   DAUGHTER 4   SON 3   Other support:   MOB states she has a good support systme.  She states her sister and uncle are her main support people.    II  PSYCHOSOCIAL DATA Information Source:  Patient Interview  Financial and Community Resources Employment:   Financial resources:  Self Pay If Medicaid - County:   Other  WIC   School / Grade:   Maternity Care Coordinator / Child Services Coordination / Early Interventions:  Cultural issues impacting care:   None identified    III  STRENGTHS Strengths  Adequate Resources  Compliance with medical plan  Home prepared for Child (including basic supplies)  Other - See comment  Supportive family/friends   Strength comment:  Pediatric follow up will be at [facility]   IV  RISK FACTORS AND CURRENT PROBLEMS Current Problem:  None   Risk Factor & Current Problem Patient Issue Family Issue Risk Factor / Current Problem Comment   N N     V  SOCIAL WORK ASSESSMENT CSW met with MOB in her first floor room/132 to complete assessment for NPNC.  MOB was very pleasant and states she and baby are doing well.  She states she is somewhat overwhelmed by having her 35th child and wishes for a BTL. CSW explained that there are consents that  need to be signed 30 days prior to this procedure and encouraged her to talk with her doctor while she is in the hospital to get this scheduled.  She states baby was unplanned and the father was not aware of the pregnancy.  She states she does not want him to be involved.  She states, "she's here now" and that she is committed to parenting her.  She looked lovingly at her and talked about how tiny and perfect she is during our conversation.  She states she still has baby supplies at home and others are helping her get what she needs.  MOB reports that she had Medicaid until her last child turned two (now three) and doesn't know why it was stopped.  She states she applied 3 times during pregnancy, but was denied.  (CSW informed Reyna/financial counselor of this and she will meet with MOB to discuss.)  She states this is the reason she did not receive PNC.  MOB states she has WIC for her two youngest children and plans to apply for baby.  She did not have WIC while she was pregnant.  MOB states all of her children live with her and her 18 year old is caring for them while she is in the hospital, with her sister and uncle checking on them   daily.  CSW informed her of hospital drug screen policy and she denies all drug use.  She states no concerns.  Baby's UDS is negative and CSW will monitor meconium screen.      VI SOCIAL WORK PLAN Social Work Plan  No Further Intervention Required / No Barriers to Discharge   Type of pt/family education:   hospital drug screen policy   If child protective services report - county:   If child protective services report - date:   Information/referral to community resources comment:   No referral needs identified at this time.   Other social work plan: 

## 2012-07-07 LAB — HEMOGLOBINOPATHY EVALUATION
Hgb A: 97.2 % (ref 96.8–97.8)
Hgb F Quant: 0 % (ref 0.0–2.0)
Hgb S Quant: 0 %

## 2012-07-07 MED ORDER — IBUPROFEN 600 MG PO TABS: 600.0000 mg | ORAL_TABLET | Freq: Four times a day (QID) | ORAL | Status: DC

## 2012-07-07 NOTE — Progress Notes (Signed)
CSW reviewed past hospital encounters and notes that MOB has had Child Protective Services involvement.  CSW contacted CPS intake worker to ensure she does not have an open case at this time.  CPS worker confirmed she does not.  No further concerns or barriers to discharge.

## 2012-07-14 ENCOUNTER — Encounter: Payer: Self-pay | Admitting: Obstetrics & Gynecology

## 2012-07-15 ENCOUNTER — Encounter: Payer: Self-pay | Admitting: Obstetrics & Gynecology

## 2012-07-20 ENCOUNTER — Encounter: Payer: Self-pay | Admitting: Obstetrics & Gynecology

## 2012-08-05 ENCOUNTER — Ambulatory Visit: Payer: Self-pay | Admitting: Obstetrics & Gynecology

## 2013-06-21 ENCOUNTER — Encounter (HOSPITAL_COMMUNITY): Payer: Self-pay | Admitting: Emergency Medicine

## 2013-06-21 ENCOUNTER — Emergency Department (INDEPENDENT_AMBULATORY_CARE_PROVIDER_SITE_OTHER)
Admission: EM | Admit: 2013-06-21 | Discharge: 2013-06-21 | Disposition: A | Discharge status: Home / Self Care | Payer: Medicaid Other | Source: Home / Self Care | Attending: Family Medicine | Admitting: Family Medicine

## 2013-06-21 DIAGNOSIS — K029 Dental caries, unspecified: Secondary | ICD-10-CM

## 2013-06-21 MED ORDER — LIDOCAINE VISCOUS 2 % MT SOLN: OROMUCOSAL | Status: DC

## 2013-06-21 MED ORDER — PENICILLIN V POTASSIUM 500 MG PO TABS: 500.0000 mg | ORAL_TABLET | Freq: Three times a day (TID) | ORAL | Status: DC

## 2013-06-21 MED ORDER — HYDROCODONE-ACETAMINOPHEN 5-325 MG PO TABS: 1.0000 | ORAL_TABLET | Freq: Four times a day (QID) | ORAL | Status: DC | PRN

## 2013-06-21 NOTE — ED Provider Notes (Signed)
CSN: [id number]     Arrival date & time 06/21/13  0831 History   First MD Initiated Contact with Patient 06/21/13 [phone]     Chief Complaint  Patient presents with  . Dental Pain   (Consider location/radiation/quality/duration/timing/severity/associated sxs/prior Treatment) Patient is a 38 y.o. female presenting with tooth pain. The history is provided by the patient.  Dental Pain Location:  Upper Upper teeth location:  15/LU 2nd molar Quality:  Aching Severity:  Mild Onset quality:  Gradual Duration:  1 day Timing:  Constant Progression:  Worsening Chronicity:  New Context: dental caries   Ineffective treatments:  Acetaminophen and NSAIDs Associated symptoms: facial swelling   Associated symptoms: no congestion, no difficulty swallowing, no drooling, no facial pain, no fever, no gum swelling, no headaches, no neck pain, no neck swelling, no oral bleeding, no oral lesions and no trismus   Associated symptoms comment:  Mild left sided facial swelling Risk factors: lack of dental care and smoking     Past Medical History  Diagnosis Date  . No pertinent past medical history    Past Surgical History  Procedure Laterality Date  . Closed reduction ulnar shaft     History reviewed. No pertinent family history. History  Substance Use Topics  . Smoking status: Current Every Day Smoker -- 2.00 packs/day    Types: Cigarettes  . Smokeless tobacco: Never Used  . Alcohol Use: No   OB History   Grav Para Term Preterm Abortions TAB SAB Ect Mult Living   10 8 5 3 2 1 1  0 1 7     Review of Systems  Constitutional: Negative for fever.  HENT: Positive for facial swelling. Negative for congestion, drooling and mouth sores.   Musculoskeletal: Negative for neck pain.  Neurological: Negative for headaches.  All other systems reviewed and are negative.    Allergies  Review of patient's allergies indicates no known allergies.  Home Medications   Current Outpatient Rx  Name  Route   Sig  Dispense  Refill  . ibuprofen (ADVIL,MOTRIN) 600 MG tablet   Oral   Take 1 tablet (600 mg total) by mouth every 6 (six) hours.   30 tablet   1    BP 109/72  Pulse 80  Temp(Src) 98.3 F (36.8 C) (Oral)  Resp 16  SpO2 100%  LMP 06/04/2013  Breastfeeding? No Physical Exam  Nursing note and vitals reviewed. Constitutional: She is oriented to person, place, and time. She appears well-developed and well-nourished. No distress.  HENT:  Head: Normocephalic and atraumatic.  Right Ear: External ear normal.  Left Ear: External ear normal.  Nose: Nose normal.  Mouth/Throat:    Eyes: Conjunctivae are normal.  Neck: Neck supple. No thyromegaly present.  Cardiovascular: Normal rate.   Pulmonary/Chest: Effort normal.  Abdominal: There is no tenderness.  Lymphadenopathy:    She has no cervical adenopathy.  Neurological: She is alert and oriented to person, place, and time.  Skin: Skin is warm and dry.  Psychiatric: She has a normal mood and affect. Her behavior is normal.    ED Course  Procedures (including critical care time) Labs Review Labs Reviewed - No data to display Imaging Review No results found.  EKG Interpretation    Date/Time:    Ventricular Rate:    PR Interval:    QRS Duration:   QT Interval:    QTC Calculation:   R Axis:     Text Interpretation:  MDM   Provided with resources for PCP and dental follow up. Meds as directed. Advised to discontinue smoking.    Jess Barters [place], [place] 06/21/13 [phone]

## 2013-06-21 NOTE — Discharge Instructions (Signed)
Dental Pain °A tooth ache may be caused by cavities (tooth decay). Cavities expose the nerve of the tooth to air and hot or cold temperatures. It may come from an infection or abscess (also called a boil or furuncle) around your tooth. It is also often caused by dental caries (tooth decay). This causes the pain you are having. °DIAGNOSIS  °Your caregiver can diagnose this problem by exam. °TREATMENT  °· If caused by an infection, it may be treated with medications which kill germs (antibiotics) and pain medications as prescribed by your caregiver. Take medications as directed. °· Only take over-the-counter or prescription medicines for pain, discomfort, or fever as directed by your caregiver. °· Whether the tooth ache today is caused by infection or dental disease, you should see your dentist as soon as possible for further care. °SEEK MEDICAL CARE IF: °The exam and treatment you received today has been provided on an emergency basis only. This is not a substitute for complete medical or dental care. If your problem worsens or new problems (symptoms) appear, and you are unable to meet with your dentist, call or return to this location. °SEEK IMMEDIATE MEDICAL CARE IF:  °· You have a fever. °· You develop redness and swelling of your face, jaw, or neck. °· You are unable to open your mouth. °· You have severe pain uncontrolled by pain medicine. °MAKE SURE YOU:  °· Understand these instructions. °· Will watch your condition. °· Will get help right away if you are not doing well or get worse. °Document Released: 05/27/2005 Document Revised: 08/19/2011 Document Reviewed: 01/13/2008 °ExitCare® Patient Information ©2014 [employer]. ° °Dental Care and Dentist Visits °Dental care supports good overall health. Regular dental visits can also help you avoid dental pain, bleeding, infection, and other more serious health problems in the future. It is important to keep the mouth healthy because diseases in the teeth, gums,  and other oral tissues can spread to other areas of the body. Some problems, such as diabetes, heart disease, and pre-term labor have been associated with poor oral health.  °See your dentist every 6 months. If you experience emergency problems such as a toothache or broken tooth, go to the dentist right away. If you see your dentist regularly, you may catch problems early. It is easier to be treated for problems in the early stages.  °WHAT TO EXPECT AT A DENTIST VISIT  °Your dentist will look for many common oral health problems and recommend proper treatment. At your regular dental visit, you can expect: °· Gentle cleaning of the teeth and gums. This includes scraping and polishing. This helps to remove the sticky substance around the teeth and gums (plaque). Plaque forms in the mouth shortly after eating. Over time, plaque hardens on the teeth as tartar. If tartar is not removed regularly, it can cause problems. Cleaning also helps remove stains. °· Periodic X-rays. These pictures of the teeth and supporting bone will help your dentist assess the health of your teeth. °· Periodic fluoride treatments. Fluoride is a natural mineral shown to help strengthen teeth. Fluoride treatment involves applying a fluoride gel or varnish to the teeth. It is most commonly done in children. °· Examination of the mouth, tongue, jaws, teeth, and gums to look for any oral health problems, such as: °· Cavities (dental caries). This is decay on the tooth caused by plaque, sugar, and acid in the mouth. It is best to catch a cavity when it is small. °· Inflammation of the gums   caused by plaque buildup (gingivitis).  Problems with the mouth or malformed or misaligned teeth.  Oral cancer or other diseases of the soft tissues or jaws. KEEP YOUR TEETH AND GUMS HEALTHY For healthy teeth and gums, follow these general guidelines as well as your dentist's specific advice:  Have your teeth professionally cleaned at the dentist every 6  months.  Brush twice daily with a fluoride toothpaste.  Floss your teeth daily.  Ask your dentist if you need fluoride supplements, treatments, or fluoride toothpaste.  Eat a healthy diet. Reduce foods and drinks with added sugar.  Avoid smoking. TREATMENT FOR ORAL HEALTH PROBLEMS If you have oral health problems, treatment varies depending on the conditions present in your teeth and gums.  Your caregiver will most likely recommend good oral hygiene at each visit.  For cavities, gingivitis, or other oral health disease, your caregiver will perform a procedure to treat the problem. This is typically done at a separate appointment. Sometimes your caregiver will refer you to another dental specialist for specific tooth problems or for surgery. SEEK IMMEDIATE DENTAL CARE IF:  You have pain, bleeding, or soreness in the gum, tooth, jaw, or mouth area.  A permanent tooth becomes loose or separated from the gum socket.  You experience a blow or injury to the mouth or jaw area. Document Released: 02/06/2011 Document Revised: 08/19/2011 Document Reviewed: 02/06/2011 [facility] Patient Information 2014 [place], [place].  Dental Caries  Dental caries (also called tooth decay) is the most common oral disease. It can occur at any age, but is more common in children and young adults.  HOW DENTAL CARIES DEVELOPS  The process of decay begins when bacteria and foods (particularly sugars and starches) combine in your mouth to produce plaque. Plaque is a substance that sticks to the hard, outer surface of a tooth (enamel). The bacteria in plaque produce acids that attack enamel. These acids may also attack the root surface of a tooth (cementum) if it is exposed. Repeated attacks dissolve these surfaces and create holes in the tooth (cavities). If left untreated, the acids destroy the other layers of the tooth.  RISK FACTORS  Frequent sipping of sugary beverages.   Frequent snacking on sugary and  starchy foods, especially those that easily get stuck in the teeth.   Poor oral hygiene.   Dry mouth.   Substance abuse such as methamphetamine abuse.   Broken or poor-fitting dental restorations.   Eating disorders.   Gastroesophageal reflux disease (GERD).   Certain radiation treatments to the head and neck. SYMPTOMS In the early stages of dental caries, symptoms are seldom present. Sometimes white, chalky areas may be seen on the enamel or other tooth layers. In later stages, symptoms may include:  Pits and holes on the enamel.  Toothache after sweet, hot, or cold foods or drinks are consumed.  Pain around the tooth.  Swelling around the tooth. DIAGNOSIS  Most of the time, dental caries is detected during a regular dental checkup. A diagnosis is made after a thorough medical and dental history is taken and the surfaces of your teeth are checked for signs of dental caries. Sometimes special instruments, such as lasers, are used to check for dental caries. Dental X-ray exams may be taken so that areas not visible to the eye (such as between the contact areas of the teeth) can be checked for cavities.  TREATMENT  If dental caries is in its early stages, it may be reversed with a fluoride treatment or an  application of a remineralizing agent at the dental office. Thorough brushing and flossing at home is needed to aid these treatments. If it is in its later stages, treatment depends on the location and extent of tooth destruction:   If a small area of the tooth has been destroyed, the destroyed area will be removed and cavities will be filled with a material such as gold, silver amalgam, or composite resin.   If a large area of the tooth has been destroyed, the destroyed area will be removed and a cap (crown) will be fitted over the remaining tooth structure.   If the center part of the tooth (pulp) is affected, a procedure called a root canal will be needed before a filling  or crown can be placed.   If most of the tooth has been destroyed, the tooth may need to be pulled (extracted). HOME CARE INSTRUCTIONS You can prevent, stop, or reverse dental caries at home by practicing good oral hygiene. Good oral hygiene includes:  Thoroughly cleaning your teeth at least twice a day with a toothbrush and dental floss.   Using a fluoride toothpaste. A fluoride mouth rinse may also be used if recommended by your dentist or health care provider.   Restricting the amount of sugary and starchy foods and sugary liquids you consume.   Avoiding frequent snacking on these foods and sipping of these liquids.   Keeping regular visits with a dentist for checkups and cleanings. PREVENTION   Practice good oral hygiene.  Consider a dental sealant. A dental sealant is a coating material that is applied by your dentist to the pits and grooves of teeth. The sealant prevents food from being trapped in them. It may protect the teeth for several years.  Ask about fluoride supplements if you live in a community without fluorinated water or with water that has a low fluoride content. Use fluoride supplements as directed by your dentist or health care provider.  Allow fluoride varnish applications to teeth if directed by your dentist or health care provider. Document Released: 02/16/2002 Document Revised: 01/27/2013 Document Reviewed: 05/29/2012 [facility] Patient Information 2014 [place], [place].

## 2013-06-21 NOTE — ED Notes (Signed)
C/o back left sided tooth pain.   On set last night.  Mild swelling.  No relief with otc pain meds.  Denies any other symptoms.

## 2013-06-22 NOTE — ED Provider Notes (Signed)
Medical screening examination/treatment/procedure(s) were performed by a resident physician or non-physician practitioner and as the supervising physician I was immediately available for consultation/collaboration.  Evan Corey, MD    Evan S Corey, MD 06/22/13 [phone] 

## 2014-02-22 ENCOUNTER — Emergency Department (HOSPITAL_COMMUNITY)
Admission: EM | Admit: 2014-02-22 | Discharge: 2014-02-22 | Disposition: A | Discharge status: Home / Self Care | Payer: Medicaid Other | Attending: Emergency Medicine | Admitting: Emergency Medicine

## 2014-02-22 ENCOUNTER — Encounter (HOSPITAL_COMMUNITY): Payer: Self-pay | Admitting: Emergency Medicine

## 2014-02-22 DIAGNOSIS — Z79899 Other long term (current) drug therapy: Secondary | ICD-10-CM | POA: Insufficient documentation

## 2014-02-22 DIAGNOSIS — H109 Unspecified conjunctivitis: Secondary | ICD-10-CM | POA: Diagnosis not present

## 2014-02-22 DIAGNOSIS — F17200 Nicotine dependence, unspecified, uncomplicated: Secondary | ICD-10-CM | POA: Insufficient documentation

## 2014-02-22 DIAGNOSIS — Z792 Long term (current) use of antibiotics: Secondary | ICD-10-CM | POA: Diagnosis not present

## 2014-02-22 DIAGNOSIS — H5789 Other specified disorders of eye and adnexa: Secondary | ICD-10-CM | POA: Insufficient documentation

## 2014-02-22 MED ORDER — ERYTHROMYCIN 5 MG/GM OP OINT: TOPICAL_OINTMENT | OPHTHALMIC | Status: DC

## 2014-02-22 MED ORDER — FLUORESCEIN SODIUM 1 MG OP STRP
1.0000 | ORAL_STRIP | Freq: Once | OPHTHALMIC | Status: AC
  Administered 2014-02-22: 1 via OPHTHALMIC
  Filled 2014-02-22: qty 1

## 2014-02-22 MED ORDER — PROPARACAINE HCL 0.5 % OP SOLN
1.0000 [drp] | Freq: Once | OPHTHALMIC | Status: AC
  Administered 2014-02-22: 1 [drp] via OPHTHALMIC
  Filled 2014-02-22: qty 15

## 2014-02-22 NOTE — ED Notes (Signed)
Notified PA, both eyes 20/20, left eye 20/50

## 2014-02-22 NOTE — ED Notes (Signed)
MD at bedside. 

## 2014-02-22 NOTE — ED Provider Notes (Signed)
CSN: [id number]     Arrival date & time 02/22/14  1156 History  This chart was scribed for non-physician practitioner Santiago Glad working with Audree Camel, MD by Littie Deeds, ED Scribe. This patient was seen in room TR04C/TR04C and the patient's care was started at 1:24 PM.       Chief Complaint  Patient presents with  . eye problems       The history is provided by the patient. No language interpreter was used.   HPI Comments: Connie Martin is a 38 y.o. female who presents to the Emergency Department complaining of erythema, drainage and edema of bilateral eyes that started this morning, worse of the left eye. She states that she woke up this morning with crusting and edema in her left eye. She applied cold rags to her left eye with mild improvement to the swelling. She notes yellowish green discharge mostly in her left eye and blurred vision. She denies sick contacts and any eye injury.   Past Medical History  Diagnosis Date  . No pertinent past medical history    Past Surgical History  Procedure Laterality Date  . Closed reduction ulnar shaft     No family history on file. History  Substance Use Topics  . Smoking status: Current Every Day Smoker -- 2.00 packs/day    Types: Cigarettes  . Smokeless tobacco: Never Used  . Alcohol Use: No   OB History   Grav Para Term Preterm Abortions TAB SAB Ect Mult Living   0 1 7     Review of Systems  Eyes: Positive for photophobia, pain, discharge, redness and visual disturbance.  All other systems reviewed and are negative.     Allergies  Review of patient's allergies indicates no known allergies.  Home Medications   Prior to Admission medications   Medication Sig Start Date End Date Taking? Authorizing Provider  HYDROcodone-acetaminophen (NORCO/VICODIN) 5-325 MG per tablet Take 1 tablet by mouth every 6 (six) hours as needed for moderate pain or severe pain. 06/21/13   Mathis Fare Presson, PA   ibuprofen (ADVIL,MOTRIN) 600 MG tablet Take 1 tablet (600 mg total) by mouth every 6 (six) hours. 07/07/12   Napoleon Form, MD  lidocaine (XYLOCAINE) 2 % solution Dip cotton swab into liquid and apply to affected area Q3hrs as needed for pain 06/21/13   Mathis Fare Presson, PA  penicillin v potassium (VEETID) 500 MG tablet Take 1 tablet (500 mg total) by mouth 3 (three) times daily. 06/21/13   Mathis Fare Presson, PA   BP 119/77  Pulse 87  Temp(Src) 98.4 F (36.9 C) (Oral)  Resp 18  SpO2 100%  LMP 01/22/2014 Physical Exam  Nursing note and vitals reviewed. Constitutional: She is oriented to person, place, and time. She appears well-developed and well-nourished. No distress.  HENT:  Head: Normocephalic and atraumatic.  Mouth/Throat: Oropharynx is clear and moist. No oropharyngeal exudate.  Eyes: EOM are normal. Pupils are equal, round, and reactive to light. Left eye exhibits discharge.  Slit lamp exam:      The left eye shows no corneal abrasion, no corneal flare, no corneal ulcer, no foreign body and no fluorescein uptake.  Some photophobia, diffuse injection of the left eye, small amount of yellowish drainage present, mild edema of left upper eyelid, no other swelling or erythema present  16 intraocular eye pressure, visual acuity: 20/20 right eye; 20/50 in left eye  Neck: Neck supple.  Cardiovascular: Normal rate, regular rhythm, normal heart sounds and intact distal pulses.   No murmur heard. Pulmonary/Chest: Effort normal and breath sounds normal. No respiratory distress. She has no wheezes. She has no rales.  Musculoskeletal: She exhibits no edema.  Neurological: She is alert and oriented to person, place, and time. No cranial nerve deficit.  Skin: Skin is warm and dry. No rash noted.  Psychiatric: She has a normal mood and affect. Her behavior is normal.    ED Course  Procedures  DIAGNOSTIC STUDIES: Oxygen Saturation is 100% on RA, nml by my interpretation.     COORDINATION OF CARE: 1:39 PM Discussed clinical suspicion of conjunctivitis with patient. Patient was given abx ointment and has agreed with the treatment plan.   Labs Review Labs Reviewed - No data to display  Imaging Review No results found.   EKG Interpretation None      MDM   Final diagnoses:  None   Patient with signs and symptoms most consistent with Bacterial Conjunctivitis.  No eye injury.  No evidence of Corneal Abrasion or Ulcer on exam.  IOP of left eye is 16.  Patient stable for discharge.  Patient discharged with Rx for Erythromycin ointment.  Patient given referral to Ophthalmology to follow up if no improvement.  Return precautions given.     Santiago Glad, PA-C 02/22/14 1547

## 2014-02-22 NOTE — ED Notes (Signed)
Patient complains of redness, drainage and swelling of bilateral eyes.   Patient states started this morning.

## 2014-02-26 NOTE — ED Provider Notes (Signed)
Medical screening examination/treatment/procedure(s) were performed by non-physician practitioner and as supervising physician I was immediately available for consultation/collaboration.  Audree Camel, MD 02/26/14 [phone]

## 2014-04-11 ENCOUNTER — Encounter (HOSPITAL_COMMUNITY): Payer: Self-pay | Admitting: Emergency Medicine

## 2015-01-01 ENCOUNTER — Inpatient Hospital Stay (HOSPITAL_COMMUNITY)
Admission: AD | Admit: 2015-01-01 | Discharge: 2015-01-01 | Disposition: A | Discharge status: Home / Self Care | Payer: Medicaid Other | Source: Ambulatory Visit | Attending: Obstetrics and Gynecology | Admitting: Obstetrics and Gynecology

## 2015-01-01 ENCOUNTER — Encounter (HOSPITAL_COMMUNITY): Payer: Self-pay

## 2015-01-01 DIAGNOSIS — N939 Abnormal uterine and vaginal bleeding, unspecified: Secondary | ICD-10-CM

## 2015-01-01 DIAGNOSIS — R109 Unspecified abdominal pain: Secondary | ICD-10-CM

## 2015-01-01 LAB — CBC
HEMATOCRIT: 35.6 % — AB (ref 36.0–46.0)
HEMOGLOBIN: 11.9 g/dL — AB (ref 12.0–15.0)
MCH: 31.6 pg (ref 26.0–34.0)
MCHC: 33.4 g/dL (ref 30.0–36.0)
MCV: 94.7 fL (ref 78.0–100.0)
Platelets: 214 10*3/uL (ref 150–400)
RBC: 3.76 MIL/uL — AB (ref 3.87–5.11)
RDW: 13.7 % (ref 11.5–15.5)
WBC: 7.4 10*3/uL (ref 4.0–10.5)

## 2015-01-01 LAB — URINALYSIS, ROUTINE W REFLEX MICROSCOPIC
Glucose, UA: NEGATIVE mg/dL
KETONES UR: NEGATIVE mg/dL
Leukocytes, UA: NEGATIVE
NITRITE: NEGATIVE
Protein, ur: NEGATIVE mg/dL
Specific Gravity, Urine: >1.03 — ABNORMAL HIGH (ref 1.005–1.030)
UROBILINOGEN UA: 1 mg/dL (ref 0.0–1.0)
pH: 6 (ref 5.0–8.0)

## 2015-01-01 LAB — URINE MICROSCOPIC-ADD ON

## 2015-01-01 LAB — POCT PREGNANCY, URINE: Preg Test, Ur: NEGATIVE

## 2015-01-01 MED ORDER — MEDROXYPROGESTERONE ACETATE 10 MG PO TABS: 10.0000 mg | ORAL_TABLET | Freq: Every day | ORAL | Status: DC

## 2015-01-01 MED ORDER — IBUPROFEN 600 MG PO TABS: 600.0000 mg | ORAL_TABLET | Freq: Four times a day (QID) | ORAL | Status: DC | PRN

## 2015-01-01 MED ORDER — KETOROLAC TROMETHAMINE 60 MG/2ML IM SOLN
60.0000 mg | Freq: Once | INTRAMUSCULAR | Status: AC
  Administered 2015-01-01: 60 mg via INTRAMUSCULAR
  Filled 2015-01-01: qty 2

## 2015-01-01 NOTE — MAU Note (Signed)
Pt states here for vaginal bleeding for past two months since receiving depo. Bleeding began again 12/12/2014. Rcvd depo injection 2 months ago and has been on depo in past however never had issues with random bleeding. Lower abd pain as well.

## 2015-01-01 NOTE — MAU Provider Note (Signed)
History     CSN: [id number]  Arrival date and time: 01/01/15 1807   First Provider Initiated Contact with Patient 01/01/15 1909      Chief Complaint  Patient presents with  . Abdominal Pain   HPI   Ms.Connie Martin is a 39 y.o. female [id number] presenting to MAU with irregular vaginal bleeding and abdominal pain. She received depo provera 2 months ago for birth control and since then she has had off and on spotting/ bleeding. Her bleeding currently is heavy like a period.   She is also having lower abdominal cramping and pain. The pain started today; the pain comes and goes. The pain feels like mild contractions. She has not taken anything for the pain.   She does not desire to stay on depo, she is interested in an IUD.   OB History    Gravida Para Term Preterm AB TAB SAB Ectopic Multiple Living   0 1 7      Past Medical History  Diagnosis Date  . No pertinent past medical history     Past Surgical History  Procedure Laterality Date  . Closed reduction ulnar shaft      History reviewed. No pertinent family history.  History  Substance Use Topics  . Smoking status: Current Every Day Smoker -- 2.00 packs/day    Types: Cigarettes  . Smokeless tobacco: Never Used  . Alcohol Use: No    Allergies: No Known Allergies  Prescriptions prior to admission  Medication Sig Dispense Refill Last Dose  . Multiple Vitamin (MULTIVITAMIN WITH MINERALS) TABS tablet Take 1 tablet by mouth daily.   01/01/2015 at Unknown time  . erythromycin ophthalmic ointment Place a 1/2 inch ribbon of ointment into the lower eyelid. (Patient not taking: Reported on 01/01/2015) 1 g 0 Completed Course at Unknown time  . HYDROcodone-acetaminophen (NORCO/VICODIN) 5-325 MG per tablet Take 1 tablet by mouth every 6 (six) hours as needed for moderate pain or severe pain. (Patient not taking: Reported on 01/01/2015) 10 tablet 0 Not Taking at Unknown time  . ibuprofen (ADVIL,MOTRIN) 600 MG  tablet Take 1 tablet (600 mg total) by mouth every 6 (six) hours. (Patient not taking: Reported on 01/01/2015) 30 tablet 1 Not Taking at Unknown time  . lidocaine (XYLOCAINE) 2 % solution Dip cotton swab into liquid and apply to affected area Q3hrs as needed for pain (Patient not taking: Reported on 01/01/2015) 20 mL 0 Not Taking at Unknown time  . penicillin v potassium (VEETID) 500 MG tablet Take 1 tablet (500 mg total) by mouth 3 (three) times daily. (Patient not taking: Reported on 01/01/2015) 21 tablet 0 Completed Course at Unknown time   Results for orders placed or performed during the hospital encounter of 01/01/15 (from the past 48 hour(s))  Pregnancy, urine POC     Status: None   Collection Time: 01/01/15  6:21 PM  Result Value Ref Range   Preg Test, Ur NEGATIVE NEGATIVE    Comment:        THE SENSITIVITY OF THIS METHODOLOGY IS >24 mIU/mL   Urinalysis, Routine w reflex microscopic (not at [facility])     Status: Abnormal   Collection Time: 01/01/15  6:23 PM  Result Value Ref Range   Color, Urine YELLOW YELLOW   APPearance CLEAR CLEAR   Specific Gravity, Urine >1.030 (H) 1.005 - 1.030   pH 6.0 5.0 - 8.0   Glucose, UA NEGATIVE NEGATIVE mg/dL   Hgb urine  dipstick LARGE (A) NEGATIVE   Bilirubin Urine SMALL (A) NEGATIVE   Ketones, ur NEGATIVE NEGATIVE mg/dL   Protein, ur NEGATIVE NEGATIVE mg/dL   Urobilinogen, UA 1.0 0.0 - 1.0 mg/dL   Nitrite NEGATIVE NEGATIVE   Leukocytes, UA NEGATIVE NEGATIVE  Urine microscopic-add on     Status: Abnormal   Collection Time: 01/01/15  6:23 PM  Result Value Ref Range   Squamous Epithelial / LPF FEW (A) RARE   WBC, UA 0-2 <3 WBC/hpf   RBC / HPF 3-6 <3 RBC/hpf   Bacteria, UA FEW (A) RARE  CBC     Status: Abnormal   Collection Time: 01/01/15  7:00 PM  Result Value Ref Range   WBC 7.4 4.0 - 10.5 K/uL   RBC 3.76 (L) 3.87 - 5.11 MIL/uL   Hemoglobin 11.9 (L) 12.0 - 15.0 g/dL   HCT 16.1 (L) 09.6 - 04.5 %   MCV 94.7 78.0 - 100.0 fL   MCH 31.6 26.0 -  34.0 pg   MCHC 33.4 30.0 - 36.0 g/dL   RDW 40.9 81.1 - 91.4 %   Platelets 214 150 - 400 K/uL    Review of Systems  Constitutional: Positive for malaise/fatigue.  Gastrointestinal: Positive for abdominal pain.  Neurological: Positive for dizziness and weakness.   Physical Exam   Blood pressure 117/72, pulse 84, temperature 98.7 F (37.1 C), temperature source Oral, resp. rate 16, last menstrual period 12/12/2014.  Physical Exam  Constitutional: She is oriented to person, place, and time. She appears well-developed and well-nourished. No distress.  HENT:  Head: Normocephalic.  Eyes: Pupils are equal, round, and reactive to light.  Neck: Neck supple.  Cardiovascular: Normal rate.   Respiratory: Effort normal and breath sounds normal.  GI: Soft. There is no tenderness.  Genitourinary:  Speculum exam: Vagina - Small amount of dark red blood. No pooling  Cervix - + contact bleeding  Bimanual exam: Cervix closed, no CMT  Uterus non tender, normal size Adnexa non tender, no masses bilaterally Chaperone present for exam.  Musculoskeletal: Normal range of motion.  Neurological: She is alert and oriented to person, place, and time.  Skin: Skin is warm. She is not diaphoretic.  Psychiatric: Her behavior is normal.    MAU Course  Procedures  MDM  Toradol 60 mg IM given Patient rates her pain 3/10 at the time of discharge   Assessment and Plan   A:  1. Abnormal vaginal bleeding   2. Abdominal cramping    P:  Discharge home in stable condition Hgb stable Bleeding precautions RX: Provera X 30 days Continue depo or call to discuss other birth control options Condoms always Return to MAU if symptoms worsen   Duane Lope, NP 01/01/2015 7:18 PM

## 2018-07-04 ENCOUNTER — Emergency Department (HOSPITAL_COMMUNITY)
Admission: EM | Admit: 2018-07-04 | Discharge: 2018-07-04 | Disposition: A | Discharge status: Home / Self Care | Payer: Medicaid Other | Attending: Emergency Medicine | Admitting: Emergency Medicine

## 2018-07-04 ENCOUNTER — Emergency Department (HOSPITAL_COMMUNITY): Payer: Medicaid Other

## 2018-07-04 ENCOUNTER — Encounter (HOSPITAL_COMMUNITY): Payer: Self-pay | Admitting: Emergency Medicine

## 2018-07-04 ENCOUNTER — Other Ambulatory Visit: Payer: Self-pay

## 2018-07-04 DIAGNOSIS — F17210 Nicotine dependence, cigarettes, uncomplicated: Secondary | ICD-10-CM | POA: Insufficient documentation

## 2018-07-04 DIAGNOSIS — R072 Precordial pain: Secondary | ICD-10-CM

## 2018-07-04 DIAGNOSIS — M546 Pain in thoracic spine: Secondary | ICD-10-CM | POA: Diagnosis present

## 2018-07-04 LAB — COMPREHENSIVE METABOLIC PANEL
ALT: 20 U/L (ref 0–44)
AST: 20 U/L (ref 15–41)
Albumin: 3.8 g/dL (ref 3.5–5.0)
Alkaline Phosphatase: 60 U/L (ref 38–126)
Anion gap: 11 (ref 5–15)
BILIRUBIN TOTAL: 0.2 mg/dL — AB (ref 0.3–1.2)
BUN: 18 mg/dL (ref 6–20)
CO2: 23 mmol/L (ref 22–32)
Calcium: 9.1 mg/dL (ref 8.9–10.3)
Chloride: 105 mmol/L (ref 98–111)
Creatinine, Ser: 0.88 mg/dL (ref 0.44–1.00)
GFR calc Af Amer: >60 mL/min (ref >60)
GFR calc non Af Amer: >60 mL/min (ref >60)
Glucose, Bld: 153 mg/dL — ABNORMAL HIGH (ref 70–99)
Potassium: 3.7 mmol/L (ref 3.5–5.1)
Sodium: 139 mmol/L (ref 135–145)
TOTAL PROTEIN: 7.7 g/dL (ref 6.5–8.1)

## 2018-07-04 LAB — CBC
HCT: 39.6 % (ref 36.0–46.0)
Hemoglobin: 13.1 g/dL (ref 12.0–15.0)
MCH: 30.8 pg (ref 26.0–34.0)
MCHC: 33.1 g/dL (ref 30.0–36.0)
MCV: 93 fL (ref 80.0–100.0)
Platelets: 266 10*3/uL (ref 150–400)
RBC: 4.26 MIL/uL (ref 3.87–5.11)
RDW: 12.7 % (ref 11.5–15.5)
WBC: 9.8 10*3/uL (ref 4.0–10.5)
nRBC: 0 % (ref 0.0–0.2)

## 2018-07-04 LAB — LIPASE, BLOOD: Lipase: 49 U/L (ref 11–51)

## 2018-07-04 LAB — I-STAT TROPONIN, ED: Troponin i, poc: 0 ng/mL (ref 0.00–0.08)

## 2018-07-04 LAB — D-DIMER, QUANTITATIVE: D-Dimer, Quant: 1.96 ug/mL-FEU — ABNORMAL HIGH (ref 0.00–0.50)

## 2018-07-04 MED ORDER — SODIUM CHLORIDE 0.9 % IV SOLN: INTRAVENOUS | Status: DC

## 2018-07-04 MED ORDER — METHOCARBAMOL 500 MG PO TABS
750.0000 mg | ORAL_TABLET | Freq: Once | ORAL | Status: AC
  Administered 2018-07-04: 750 mg via ORAL
  Filled 2018-07-04: qty 2

## 2018-07-04 MED ORDER — ACETAMINOPHEN 500 MG PO TABS
1000.0000 mg | ORAL_TABLET | Freq: Once | ORAL | Status: AC
  Administered 2018-07-04: 1000 mg via ORAL
  Filled 2018-07-04: qty 2

## 2018-07-04 MED ORDER — TRAMADOL HCL 50 MG PO TABS
50.0000 mg | ORAL_TABLET | Freq: Once | ORAL | Status: AC
  Administered 2018-07-04: 50 mg via ORAL
  Filled 2018-07-04: qty 1

## 2018-07-04 MED ORDER — IOPAMIDOL (ISOVUE-370) INJECTION 76%
75.0000 mL | Freq: Once | INTRAVENOUS | Status: AC | PRN
  Administered 2018-07-04: 75 mL via INTRAVENOUS

## 2018-07-04 MED ORDER — IOPAMIDOL (ISOVUE-370) INJECTION 76%
INTRAVENOUS | Status: AC
  Filled 2018-07-04: qty 100

## 2018-07-04 MED ORDER — METHOCARBAMOL 750 MG PO TABS: 750.0000 mg | ORAL_TABLET | Freq: Three times a day (TID) | ORAL | 0 refills | Status: DC | PRN

## 2018-07-04 NOTE — ED Provider Notes (Signed)
[facility] Provider Note   CSN: [id number] Arrival date & time: 07/04/18  1905     History   Chief Complaint No chief complaint on file.   HPI Connie Martin is a 43 y.o. female.  Patient c/o pain to mid chest and right upper back for past day. Symptoms occurred at rest, constant, dull, moderate, non radiating. Sl worse w deep breath, but pain constant. Denies similar pain in past. Denies back or chest strain or injury. No heartburn, denies gerd. Denies abd pain or hx gallstones/pud. Denies fever or chills. Denies cough or uri symptoms. Mild sob. No nv or diaphoresis. No leg pain or swelling. No fam hx premature cad. No hx dvt or pe.   The history is provided by the patient.    Past Medical History:  Diagnosis Date  . No pertinent past medical history     There are no active problems to display for this patient.   Past Surgical History:  Procedure Laterality Date  . CLOSED REDUCTION ULNAR SHAFT       OB History    Gravida  10   Para  8   Term  5   Preterm  3   AB  2   Living  7     SAB  1   TAB  1   Ectopic  0   Multiple  1   Live Births  3            Home Medications    Prior to Admission medications   Medication Sig Start Date End Date Taking? Authorizing Provider  erythromycin ophthalmic ointment Place a 1/2 inch ribbon of ointment into the lower eyelid. Patient not taking: Reported on 01/01/2015 02/22/14   Santiago GladLaisure, Heather, PA-C  HYDROcodone-acetaminophen (NORCO/VICODIN) 5-325 MG per tablet Take 1 tablet by mouth every 6 (six) hours as needed for moderate pain or severe pain. Patient not taking: Reported on 01/01/2015 06/21/13   Ria ClockPresson, Jennifer Lee H, PA  ibuprofen (ADVIL,MOTRIN) 600 MG tablet Take 1 tablet (600 mg total) by mouth every 6 (six) hours as needed. 01/01/15   Rasch, Victorino DikeJennifer I, NP  lidocaine (XYLOCAINE) 2 % solution Dip cotton swab into liquid and apply to affected area Q3hrs as needed for  pain Patient not taking: Reported on 01/01/2015 06/21/13   Ria ClockPresson, Jennifer Lee H, PA  medroxyPROGESTERone (PROVERA) 10 MG tablet Take 1 tablet (10 mg total) by mouth daily. 01/01/15   Rasch, Victorino DikeJennifer I, NP  Multiple Vitamin (MULTIVITAMIN WITH MINERALS) TABS tablet Take 1 tablet by mouth daily.    [provider]  penicillin v potassium (VEETID) 500 MG tablet Take 1 tablet (500 mg total) by mouth 3 (three) times daily. Patient not taking: Reported on 01/01/2015 06/21/13   Presson, Mathis FareJennifer Lee H, PA    Family History No family history on file.  Social History Social History   Tobacco Use  . Smoking status: Current Every Day Smoker    Packs/day: 2.00    Types: Cigarettes  . Smokeless tobacco: Never Used  Substance Use Topics  . Alcohol use: No  . Drug use: Yes    Types: Marijuana    Comment: Stopped last week     Allergies   Patient has no known allergies.   Review of Systems Review of Systems  Constitutional: Negative for fever.  HENT: Negative for sore throat.   Eyes: Negative for redness.  Respiratory: Negative for shortness of breath.   Cardiovascular: Positive  for chest pain.  Gastrointestinal: Negative for abdominal pain, nausea and vomiting.  Endocrine: Negative for polyuria.  Genitourinary: Negative for flank pain.  Musculoskeletal: Positive for back pain. Negative for neck pain.  Skin: Negative for rash.  Neurological: Negative for headaches.  Hematological: Does not bruise/bleed easily.  Psychiatric/Behavioral: Negative for confusion.     Physical Exam Updated Vital Signs There were no vitals taken for this visit.  Physical Exam Vitals signs and nursing note reviewed.  Constitutional:      Appearance: Normal appearance. She is well-developed.  HENT:     Head: Atraumatic.     Nose: Nose normal.     Mouth/Throat:     Mouth: Mucous membranes are moist.  Eyes:     General: No scleral icterus.    Conjunctiva/sclera: Conjunctivae normal.      Pupils: Pupils are equal, round, and reactive to light.  Neck:     Musculoskeletal: Normal range of motion and neck supple. No neck rigidity or muscular tenderness.     Trachea: No tracheal deviation.  Cardiovascular:     Rate and Rhythm: Normal rate and regular rhythm.     Pulses: Normal pulses.     Heart sounds: Normal heart sounds. No murmur. No friction rub. No gallop.   Pulmonary:     Effort: Pulmonary effort is normal. No respiratory distress.     Breath sounds: Normal breath sounds.  Chest:     Chest wall: No tenderness.  Abdominal:     General: Bowel sounds are normal. There is no distension.     Palpations: Abdomen is soft.     Tenderness: There is no abdominal tenderness. There is no guarding.  Genitourinary:    Comments: No cva tenderness.  Musculoskeletal:        General: No swelling.     Comments: T/L spine non tender, aligned. Right thoracic muscular tenderness ?spasm.   Skin:    General: Skin is warm and dry.     Findings: No rash.  Neurological:     Mental Status: She is alert.     Comments: Alert, speech normal.   Psychiatric:     Comments: Anxious appearing.       ED Treatments / Results  Labs (all labs ordered are listed, but only abnormal results are displayed) Results for orders placed or performed during the hospital encounter of 07/04/18  CBC  Result Value Ref Range   WBC 9.8 4.0 - 10.5 K/uL   RBC 4.26 3.87 - 5.11 MIL/uL   Hemoglobin 13.1 12.0 - 15.0 g/dL   HCT 40.9 81.1 - 91.4 %   MCV 93.0 80.0 - 100.0 fL   MCH 30.8 26.0 - 34.0 pg   MCHC 33.1 30.0 - 36.0 g/dL   RDW 78.2 95.6 - 21.3 %   Platelets 266 150 - 400 K/uL   nRBC 0.0 0.0 - 0.2 %  Comprehensive metabolic panel  Result Value Ref Range   Sodium 139 135 - 145 mmol/L   Potassium 3.7 3.5 - 5.1 mmol/L   Chloride 105 98 - 111 mmol/L   CO2 23 22 - 32 mmol/L   Glucose, Bld 153 (H) 70 - 99 mg/dL   BUN 18 6 - 20 mg/dL   Creatinine, Ser 0.86 0.44 - 1.00 mg/dL   Calcium 9.1 8.9 - 57.8 mg/dL    Total Protein 7.7 6.5 - 8.1 g/dL   Albumin 3.8 3.5 - 5.0 g/dL   AST 20 15 - 41 U/L   ALT 20  0 - 44 U/L   Alkaline Phosphatase 60 38 - 126 U/L   Total Bilirubin 0.2 (L) 0.3 - 1.2 mg/dL   GFR calc non Af Amer >60 >60 mL/min   GFR calc Af Amer >60 >60 mL/min   Anion gap 11 5 - 15  D-dimer, quantitative (not at [facility])  Result Value Ref Range   D-Dimer, Quant 1.96 (H) 0.00 - 0.50 ug/mL-FEU  Lipase, blood  Result Value Ref Range   Lipase 49 11 - 51 U/L  I-stat troponin, ED  Result Value Ref Range   Troponin i, poc 0.00 0.00 - 0.08 ng/mL   Comment 3           Ct Angio Chest Pe W/cm &/or Wo Cm  Result Date: 07/04/2018 CLINICAL DATA:  PE suspected, intermediate prob, positive D-dimer. Chest pain and shortness of breath. EXAM: CT ANGIOGRAPHY CHEST WITH CONTRAST TECHNIQUE: Multidetector CT imaging of the chest was performed using the standard protocol during bolus administration of intravenous contrast. Multiplanar CT image reconstructions and MIPs were obtained to evaluate the vascular anatomy. CONTRAST:  65mL ISOVUE-370 IOPAMIDOL (ISOVUE-370) INJECTION 76% COMPARISON:  Chest radiograph earlier this day. FINDINGS: Cardiovascular: There are no filling defects within the pulmonary arteries to suggest pulmonary embolus. Thoracic aorta is normal in caliber without dissection. Common origin of the brachiocephalic and left common carotid artery, normal variant anatomy. Heart is normal in size. No pericardial effusion. Mediastinum/Nodes: Ill-defined soft tissue density in the anterior mediastinum without well-defined mass. No enlarged mediastinal or hilar lymph nodes. Visualized thyroid gland is normal. The esophagus is decompressed. Prominent left axillary and subpectoral lymph nodes, all nodes are subcentimeter in short axis. No obvious lesion in the included left breast. Few prominent lymph nodes in the right axilla and subpectoral regions. Lungs/Pleura: No focal airspace disease or pleural fluid. No  evidence of pulmonary edema. Mild central bronchial thickening. No pulmonary mass. Upper Abdomen: Ingested material in the stomach. No acute or suspicious findings. Musculoskeletal: There are no acute or suspicious osseous abnormalities. Review of the MIP images confirms the above findings. IMPRESSION: 1. No pulmonary embolus. 2. Mild central bronchial thickening, can be seen with bronchitis or asthma. 3. Ill-defined soft tissue density in the anterior mediastinum without well-defined mass. This is likely a recurrent tear residual thymus, but nonspecific. Consider CT follow-up in 3 months to ensure imaging stability. 4. Prominent left greater than right axillary and subpectoral lymph nodes, all subcentimeter in short axis. No well-defined breast lesion on CT, however recommend nonemergent mammography. These can also be followed up on CT as mentioned above. Electronically Signed   By: Narda Rutherford M.D.   On: 07/04/2018 20:49   Dg Chest Port 1 View  Result Date: 07/04/2018 CLINICAL DATA:  Central chest pain radiating to the back. EXAM: PORTABLE CHEST 1 VIEW COMPARISON:  02/16/2010 FINDINGS: Heart size upper normal with unchanged mediastinal contours. No confluent airspace disease, pulmonary edema, pleural effusion or pneumothorax. No acute osseous abnormalities are seen. IMPRESSION: Upper normal heart size.  No acute pulmonary process. Electronically Signed   By: Narda Rutherford M.D.   On: 07/04/2018 19:54    EKG EKG Interpretation  Date/Time:  Saturday July 04 2018 19:14:26 EST Ventricular Rate:  111 PR Interval:    QRS Duration: 67 QT Interval:  331 QTC Calculation: 450 R Axis:   68 Text Interpretation:  Sinus tachycardia Baseline wander Nonspecific T wave abnormality No previous tracing Confirmed by Cathren Laine ([id number]) on 07/04/2018 7:16:36 PM   Radiology Ct  Angio Chest Pe W/cm &/or Wo Cm  Result Date: 07/04/2018 CLINICAL DATA:  PE suspected, intermediate prob, positive D-dimer.  Chest pain and shortness of breath. EXAM: CT ANGIOGRAPHY CHEST WITH CONTRAST TECHNIQUE: Multidetector CT imaging of the chest was performed using the standard protocol during bolus administration of intravenous contrast. Multiplanar CT image reconstructions and MIPs were obtained to evaluate the vascular anatomy. CONTRAST:  15mL ISOVUE-370 IOPAMIDOL (ISOVUE-370) INJECTION 76% COMPARISON:  Chest radiograph earlier this day. FINDINGS: Cardiovascular: There are no filling defects within the pulmonary arteries to suggest pulmonary embolus. Thoracic aorta is normal in caliber without dissection. Common origin of the brachiocephalic and left common carotid artery, normal variant anatomy. Heart is normal in size. No pericardial effusion. Mediastinum/Nodes: Ill-defined soft tissue density in the anterior mediastinum without well-defined mass. No enlarged mediastinal or hilar lymph nodes. Visualized thyroid gland is normal. The esophagus is decompressed. Prominent left axillary and subpectoral lymph nodes, all nodes are subcentimeter in short axis. No obvious lesion in the included left breast. Few prominent lymph nodes in the right axilla and subpectoral regions. Lungs/Pleura: No focal airspace disease or pleural fluid. No evidence of pulmonary edema. Mild central bronchial thickening. No pulmonary mass. Upper Abdomen: Ingested material in the stomach. No acute or suspicious findings. Musculoskeletal: There are no acute or suspicious osseous abnormalities. Review of the MIP images confirms the above findings. IMPRESSION: 1. No pulmonary embolus. 2. Mild central bronchial thickening, can be seen with bronchitis or asthma. 3. Ill-defined soft tissue density in the anterior mediastinum without well-defined mass. This is likely a recurrent tear residual thymus, but nonspecific. Consider CT follow-up in 3 months to ensure imaging stability. 4. Prominent left greater than right axillary and subpectoral lymph nodes, all  subcentimeter in short axis. No well-defined breast lesion on CT, however recommend nonemergent mammography. These can also be followed up on CT as mentioned above. Electronically Signed   By: Narda Rutherford M.D.   On: 07/04/2018 20:49   Dg Chest Port 1 View  Result Date: 07/04/2018 CLINICAL DATA:  Central chest pain radiating to the back. EXAM: PORTABLE CHEST 1 VIEW COMPARISON:  02/16/2010 FINDINGS: Heart size upper normal with unchanged mediastinal contours. No confluent airspace disease, pulmonary edema, pleural effusion or pneumothorax. No acute osseous abnormalities are seen. IMPRESSION: Upper normal heart size.  No acute pulmonary process. Electronically Signed   By: Narda Rutherford M.D.   On: 07/04/2018 19:54    Procedures Procedures (including critical care time)  Medications Ordered in ED Medications  0.9 %  sodium chloride infusion (has no administration in time range)     Initial Impression / Assessment and Plan / ED Course  I have reviewed the triage vital signs and the nursing notes.  Pertinent labs & imaging results that were available during my care of the patient were reviewed by me and considered in my medical decision making (see chart for details).  Labs. Imaging.   Reviewed nursing notes and prior charts for additional history.   cxr reviewed - no pna.  Labs reviewed - after symptoms, constant, for 1 days time, trop is normal, 0 - symptoms do not appears c/w acs.  ddimer is high - will get cta.   cta reviewed  - neg for PE. Incidental findings noted, shared w pt - pcp f/u.  Acetaminophen po. Robaxin po.  Pt does have marked muscular tenderness right mid to upper thoracic area and medial/inf to right scapula ?spasm.   Pt reports symptoms improved.  Pt currently appears  stable for d/c.   Return precautions provided.     Final Clinical Impressions(s) / ED Diagnoses   Final diagnoses:  None    ED Discharge Orders    None       Cathren LaineSteinl, Idelle Reimann,  MD 07/04/18 2118

## 2018-07-04 NOTE — Discharge Instructions (Addendum)
It was our pleasure to provide your ER care today - we hope that you feel better.  Take acetaminophen and/or ibuprofen as need for pain. You may also try robaxin as need for muscle pain/spasm.   Your ct scan was read as showing no pulmonary embolism. Incidental note was made of: IMPRESSION: 1. Ill-defined soft tissue density in the anterior mediastinum without well-defined mass. This is likely a recurrent tear residual thymus, but nonspecific. Consider CT follow-up in 3 months to ensure imaging stability. 2. Prominent left greater than right axillary and subpectoral lymph nodes, all subcentimeter in short axis. No well-defined breast lesion on CT,  recommend non-emergent mammography. These can also be followed up on CT as mentioned above.  For these incidental CT findings, please follow up with primary care doctor in the coming week - have them review CT report, and arrange follow up testing/evaluation. Also have your blood pressure rechecked as it is high today.   Return to ER if worse, new symptoms, fevers, increased trouble breathing, recurrent or persistent chest pain, severe abdominal pain, other concern.

## 2018-07-04 NOTE — ED Triage Notes (Signed)
Pt reports middle back pain radiating through to her central chest that woke her up out of her sleep last night. Pt reports feeling like near syncopal, denies SHOB, nausea, vomiting, or dizziness. Pt reports worsening eating and movement.

## 2018-09-29 NOTE — Congregational Nurse Program (Signed)
  Dept: [phone]   Congregational Nurse Program Note  Date of Encounter: 09/29/2018  Past Medical History: No past medical history on file.  Encounter Details: CNP Questionnaire - 09/29/18 1844      Questionnaire   Patient Status  Not Applicable    Race  Black or African American    Location Patient Served At  Not Applicable    Insurance  Medicaid    Uninsured  Not Applicable    Food  No food insecurities    Housing/Utilities  No permanent housing    Transportation  Within past 12 months, lack of transportation negatively impacted life    Interpersonal Safety  Yes, feel physically and emotionally safe where you currently live    Medication  No medication insecurities    Medical Provider  No    Referrals  Primary Care Provider/Clinic;Area Agency    ED Visit Averted  Not Applicable    Life-Saving Intervention Made  Not Applicable     Initial visit for this 43 year old woman with 3 children that I have seen for several issues  . Mother presents today for her self as nurse is encouraging her to take care of herself. She states she has no chronic health problems ,not on any medications at the present but has been connected to get a PCP and an appointment has been made for 10-12-2018. She was also encouraged to call for initial counseling sessions as she is dealing with lots of issues with her children ,especially her son whom is on medications for behavior management  Problems and she has had problems managing him. She is a very quite woman whom seems to be dealing with her situation fairly well but needs support and direction. She receives food stamps .  Nurse will follow up on tomorrow to check on follow up in setting up therapy appointments.

## 2018-10-20 NOTE — Congregational Nurse Program (Signed)
  Dept: [phone]   Congregational Nurse Program Note  Date of Encounter: 10/20/2018  Past Medical History: Past Medical History:  Diagnosis Date  . No pertinent past medical history     Encounter Details: CNP Questionnaire - 10/20/18 1833      Questionnaire   Patient Status  Not Applicable    Race  Black or African American    Location Patient Served At  Not Applicable    Insurance  Medicaid    Uninsured  Not Applicable    Food  No food insecurities    Housing/Utilities  No permanent housing    Transportation  No transportation needs    Interpersonal Safety  Yes, feel physically and emotionally safe where you currently live    Medication  No medication insecurities    Medical Provider  No    Referrals  Primary Care Provider/Clinic    ED Visit Averted  Not Applicable    Life-Saving Intervention Made  Not Applicable      Talked with Connie Martin about her PCPt ,states they never called her and she still doesn't have a PCP. Nurse will call and called for her an appointment. Appointment made for 10-27-2018 at 10:10 am will do a virtual appointment as client has no PCP an has some depression. . Client always has an sad affect .  She only talks when you ask her a question ,talked with her about advocating for her needs and her children . Manages fairly well but seems overwhelmed at times.  Encouraged her case manager to make sure she keeps appointment next week Follow next week

## 2018-10-27 ENCOUNTER — Telehealth: Payer: Self-pay

## 2018-10-27 ENCOUNTER — Other Ambulatory Visit: Payer: Self-pay

## 2018-10-27 ENCOUNTER — Encounter: Payer: Self-pay | Admitting: Family Medicine

## 2018-10-27 ENCOUNTER — Ambulatory Visit (INDEPENDENT_AMBULATORY_CARE_PROVIDER_SITE_OTHER): Payer: Medicaid Other | Admitting: Family Medicine

## 2018-10-27 DIAGNOSIS — F331 Major depressive disorder, recurrent, moderate: Secondary | ICD-10-CM | POA: Diagnosis not present

## 2018-10-27 DIAGNOSIS — F411 Generalized anxiety disorder: Secondary | ICD-10-CM

## 2018-10-27 MED ORDER — ESCITALOPRAM OXALATE 10 MG PO TABS: 10.0000 mg | ORAL_TABLET | Freq: Every day | ORAL | 1 refills | Status: DC

## 2018-10-27 MED ORDER — TRAZODONE HCL 50 MG PO TABS: 50.0000 mg | ORAL_TABLET | Freq: Every evening | ORAL | 1 refills | Status: DC | PRN

## 2018-10-27 MED FILL — ESCITALOPRAM 10 MG TABLET: 10 | 30 days supply | Qty: 30 | Fill #0

## 2018-10-27 MED FILL — traZODone HCL 50 MG TABS: 50 | 15 days supply | Qty: 30 | Fill #0

## 2018-10-27 NOTE — Telephone Encounter (Signed)
Good Morning,  We just had our initial primary care visit with Connie Martin. She wanted me to reach out to you about her upcoming appointment because she didn't have anything to write the details down with.  She is scheduled to come in the office to see provider Joaquin Courts, FNP-C on 11/19/2018 @ 10:10 AM. She will need to come in fasting so that we can get updated bloodwork.  She is also scheduled for a televisit with our Licensed Clinical Social Worker on 11/03/2018 @ 2 PM.  Thanks.

## 2018-10-27 NOTE — Telephone Encounter (Signed)
TC to pharmacy ,medications can be picked up ,nurse will pick up tomorrow ,woll need co pays an copy of medicaid card. case manager will let client know of nurse pick up and in future her need to take care of co pays .

## 2018-10-27 NOTE — Progress Notes (Signed)
Virtual Visit via Telephone Note  I connected with Connie Martin on 10/27/18 at 10:10 AM EDT by telephone and verified that I am speaking with the correct person using two identifiers.  Location: Patient: Located at home during today's encounter  Provider: Located at primary care office     I discussed the limitations, risks, security and privacy concerns of performing an evaluation and management service by telephone and the availability of in person appointments. I also discussed with the patient that there may be a patient responsible charge related to this service. The patient expressed understanding and agreed to proceed.   History of Present Illness: Connie Martin wishes to establish care today. She was referred by the congregational nurse here for evaluation of depression and anxiety. Patient reports a long history of depression anxiety associated with "a lot things that has happened in my life". She endorses a high level of stress at present due to living at the [facility] with two out of her seven children. Four of her seven children are grown and two not with her and this worsens depression. She is unable to sleep do to constant racing thoughts at night time. Reports that she been through a lot during her childhood which the thoughts effect her mostly at night. She endorses suicidal thoughts, however, doesn't have a plan and or access to weapon. She endorses thoughts come and go when her mood is low. She is open to counseling and medication therapy. She has been prescribed medication in her late teens by a therapist and was told at that time, she suffered from bipolar with depression and anxiety. Uncertain of what medications she was previously prescribed. Depression screen PHQ 2/9 10/27/2018  Decreased Interest 1  Down, Depressed, Hopeless 2  PHQ - 2 Score 3  Altered sleeping 3  Tired, decreased energy 3  Change in appetite 2  Feeling bad or failure about yourself  1  Trouble  concentrating 2  Moving slowly or fidgety/restless 2  Suicidal thoughts 1  PHQ-9 Score 17  Difficult doing work/chores Very difficult    GAD 7 : Generalized Anxiety Score 10/27/2018  Nervous, Anxious, on Edge 2  Control/stop worrying 2  Worry too much - different things 2  Trouble relaxing 3  Restless 2  Easily annoyed or irritable 1  Afraid - awful might happen 2  Total GAD 7 Score 14  Anxiety Difficulty Very difficult    Assessment and Plan: 1. Moderate episode of recurrent major depressive disorder (HCC) 2. GAD (generalized anxiety disorder) For depression and anxiety symptoms we will start Lexapro 10 mg once daily at bedtime.  For insomnia related to anxiety symptoms will prescribe trazodone 50 to 100 mg as needed 1 hour before bedtime to induce sleep and manage depression. Patient has been referred to Jenel LucksJasmine Lewis LCSW for counseling services and available mental health resources.  Follow Up Instructions: CPE and follow-up depression and anxiety 3-4 weeks   Meds ordered this encounter  Medications  . traZODone (DESYREL) 50 MG tablet    Sig: Take 1-2 tablets (50-100 mg total) by mouth at bedtime as needed for sleep.    Dispense:  30 tablet    Refill:  1    Please mail prescription  . escitalopram (LEXAPRO) 10 MG tablet    Sig: Take 1 tablet (10 mg total) by mouth at bedtime.    Dispense:  30 tablet    Refill:  1    Please mail prescription    I discussed the  assessment and treatment plan with the patient. The patient was provided an opportunity to ask questions and all were answered. The patient agreed with the plan and demonstrated an understanding of the instructions.   The patient was advised to call back or seek an in-person evaluation if the symptoms worsen or if the condition fails to improve as anticipated.  I provided 30 minutes of non-face-to-face time during this encounter.   Joaquin Courts, FNP -C

## 2018-10-27 NOTE — Progress Notes (Deleted)
Called patient to initiate their telephone visit with provider Joaquin Courts, FNP-C. Verified date of birth. Referred by Feliberto Harts, RN. KWalker, CMA.

## 2018-10-28 NOTE — Congregational Nurse Program (Signed)
  Dept: [phone]   Congregational Nurse Program Note  Date of Encounter: 10/28/2018  Past Medical History: Past Medical History:  Diagnosis Date  . No pertinent past medical history     Encounter Details: CNP Questionnaire - 10/28/18 1836      Questionnaire   Patient Status  Not Applicable    Race  Black or African American    Location Patient Served At  Not Applicable    Insurance  Medicaid    Uninsured  Not Applicable    Food  No food insecurities    Housing/Utilities  No permanent housing    Transportation  No transportation needs    Interpersonal Safety  Yes, feel physically and emotionally safe where you currently live    Medication  Yes, have medication insecurities;Provided medication assistance    Medical Provider  Yes    Referrals  Area Agency;Primary Care Provider/Clinic    ED Visit Averted  Not Applicable    Life-Saving Intervention Made  Not Applicable

## 2018-10-28 NOTE — Congregational Nurse Program (Signed)
  Dept: [phone]   Congregational Nurse Program Note  Date of Encounter: 10/28/2018  Past Medical History: Past Medical History:  Diagnosis Date  . No pertinent past medical history     Encounter Details: CNP Questionnaire - 10/28/18 1836      Questionnaire   Patient Status  Not Applicable    Race  Black or African American    Location Patient Served At  Not Applicable    Insurance  Medicaid    Uninsured  Not Applicable    Food  No food insecurities    Housing/Utilities  No permanent housing    Transportation  No transportation needs    Interpersonal Safety  Yes, feel physically and emotionally safe where you currently live    Medication  Yes, have medication insecurities;Provided medication assistance    Medical Provider  Yes    Referrals  Area Agency;Primary Care Provider/Clinic    ED Visit Averted  Not Applicable    Life-Saving Intervention Made  Not Applicable      In for follow up to check with nurse regarding her medications. Counseled to take at the same time of day pm ( evenings ) take Trazodone as needed for sleep ,states she has been on that medication before. Thanks nurse for picking up her medications. Cautioned regarding side effects ,will let us know if she has problems ,reminded of appointment on 5-26 with SW and next follow up with PCP. Follow weekly.

## 2018-11-03 ENCOUNTER — Institutional Professional Consult (permissible substitution): Payer: Medicaid Other | Admitting: Licensed Clinical Social Worker

## 2018-11-11 NOTE — Congregational Nurse Program (Signed)
  Dept: [phone]   Congregational Nurse Program Note  Date of Encounter: 11/11/2018  Past Medical History: Past Medical History:  Diagnosis Date  . No pertinent past medical history     Encounter Details: CNP Questionnaire - 11/11/18 1844      Questionnaire   Patient Status  Not Applicable    Race  Black or African American    Location Patient Served At  Not Applicable    Insurance  Medicaid    Uninsured  Not Applicable    Food  No food insecurities    Housing/Utilities  No permanent housing    Transportation  Within past 12 months, lack of transportation negatively impacted life    Interpersonal Safety  Yes, feel physically and emotionally safe where you currently live    Medication  No medication insecurities    Medical Provider  Yes    Referrals  Area Agency;Primary Care Provider/Clinic    ED Visit Averted  Not Applicable    Life-Saving Intervention Made  Not Applicable     Nurse stopped client in hallway to check on her and how the medication was working for her depression States it is fine somewhat drowsy during the day but no side effects . Ask about her SW appointment states it was cancelled .Reminded of upcoming appointment and will put a notice in her mailbox as well as the case managers . She will have blood work done reminded to not eat /drink after midnight for appointment.

## 2018-11-18 ENCOUNTER — Telehealth: Payer: Self-pay

## 2018-11-18 NOTE — Telephone Encounter (Signed)
Called patient to do their pre-visit COVID screening.  Call went to voicemail unable to do prescreening.

## 2018-11-19 ENCOUNTER — Other Ambulatory Visit: Payer: Medicaid Other

## 2018-11-19 ENCOUNTER — Ambulatory Visit: Payer: Medicaid Other | Admitting: Family Medicine

## 2018-11-20 ENCOUNTER — Other Ambulatory Visit: Payer: Self-pay | Admitting: *Deleted

## 2018-11-20 DIAGNOSIS — Z20822 Contact with and (suspected) exposure to covid-19: Secondary | ICD-10-CM

## 2018-11-21 ENCOUNTER — Other Ambulatory Visit: Payer: Self-pay | Admitting: *Deleted

## 2018-11-21 DIAGNOSIS — Z20822 Contact with and (suspected) exposure to covid-19: Secondary | ICD-10-CM

## 2018-11-23 LAB — NOVEL CORONAVIRUS, NAA: SARS-CoV-2, NAA: NOT DETECTED

## 2019-01-22 ENCOUNTER — Other Ambulatory Visit: Payer: Self-pay

## 2019-01-22 DIAGNOSIS — Z20822 Contact with and (suspected) exposure to covid-19: Secondary | ICD-10-CM

## 2019-01-23 LAB — NOVEL CORONAVIRUS, NAA: SARS-CoV-2, NAA: NOT DETECTED

## 2019-04-18 ENCOUNTER — Other Ambulatory Visit: Payer: Self-pay

## 2019-04-18 DIAGNOSIS — Z20822 Contact with and (suspected) exposure to covid-19: Secondary | ICD-10-CM

## 2019-04-19 LAB — NOVEL CORONAVIRUS, NAA: SARS-CoV-2, NAA: NOT DETECTED

## 2020-06-23 ENCOUNTER — Emergency Department (HOSPITAL_COMMUNITY)
Admission: EM | Admit: 2020-06-23 | Discharge: 2020-06-23 | Discharge status: Against Medical Advice | Payer: Medicaid Other | Attending: Emergency Medicine | Admitting: Emergency Medicine

## 2020-06-23 ENCOUNTER — Other Ambulatory Visit: Payer: Self-pay

## 2020-06-23 ENCOUNTER — Encounter (HOSPITAL_COMMUNITY): Payer: Self-pay

## 2020-06-23 DIAGNOSIS — F17210 Nicotine dependence, cigarettes, uncomplicated: Secondary | ICD-10-CM | POA: Diagnosis not present

## 2020-06-23 DIAGNOSIS — I10 Essential (primary) hypertension: Secondary | ICD-10-CM | POA: Insufficient documentation

## 2020-06-23 DIAGNOSIS — R0602 Shortness of breath: Secondary | ICD-10-CM | POA: Insufficient documentation

## 2020-06-23 DIAGNOSIS — R202 Paresthesia of skin: Secondary | ICD-10-CM | POA: Insufficient documentation

## 2020-06-23 DIAGNOSIS — R0789 Other chest pain: Secondary | ICD-10-CM

## 2020-06-23 DIAGNOSIS — R040 Epistaxis: Secondary | ICD-10-CM | POA: Diagnosis not present

## 2020-06-23 NOTE — ED Provider Notes (Addendum)
[facility] EMERGENCY DEPARTMENT Provider Note   CSN: [id number] Arrival date & time: 06/23/20  0023     History Chief Complaint  Patient presents with  . Epistaxis  . Hypertension    Connie Martin is a 45 y.o. female.  Patient presents via EMS with episode of nosebleeding as well as chest pain.  States she did not eat anything since yesterday morning.  She was sitting resting at home last night about midnight when she had all of a sudden some hot flashes, tingling in both sides of her face and a nosebleed on the right.  She denies any trauma to her nose.  This was going on for several minutes and then she called EMS.  After this, she developed some "grabbing" to the right side of her chest that lasted for 2 or 3 minutes and has since resolved.  There is no radiation of pain.  There are some associated shortness of breath.  No nausea or vomiting.  No cough or fever.  Chest pain is now resolved.  She is not certain what made it better or worse.  She been waiting in the ED for approximately 7 hours prior to evaluation.  She feels improved and wants to go home.  She is not dizzy or lightheaded.  There is no focal weakness, numbness or tingling.  No chest pain or shortness of breath currently.  Denies any cardiac history.  Denies any history of nosebleeds in the past and no anticoagulation use.  Did have panic attacks several years ago when she was pregnant but none since.  No regular medication use.  No illicit drug use.  The history is provided by the patient.  Epistaxis Associated symptoms: congestion   Associated symptoms: no dizziness, no fever, no headaches and no sinus pain   Hypertension Associated symptoms include chest pain and shortness of breath. Pertinent negatives include no abdominal pain and no headaches.       Past Medical History:  Diagnosis Date  . No pertinent past medical history     There are no problems to display for this patient.   Past  Surgical History:  Procedure Laterality Date  . CLOSED REDUCTION ULNAR SHAFT       OB History    Gravida  10   Para  8   Term  5   Preterm  3   AB  2   Living  7     SAB  1   IAB  1   Ectopic  0   Multiple  1   Live Births  3           Family History  Problem Relation Age of Onset  . Asthma Daughter   . Diabetes Maternal Grandmother   . Hyperlipidemia Maternal Grandmother   . Hypertension Maternal Grandmother   . Heart disease Maternal Grandmother   . Stroke Neg Hx     Social History   Tobacco Use  . Smoking status: Current Every Day Smoker    Packs/day: 1.00    Types: Cigarettes  . Smokeless tobacco: Never Used  Vaping Use  . Vaping Use: Never used  Substance Use Topics  . Alcohol use: No  . Drug use: Not Currently    Types: Marijuana    Comment: Stopped last week    Home Medications Prior to Admission medications   Medication Sig Start Date End Date Taking? Authorizing Provider  escitalopram (LEXAPRO) 10 MG tablet Take 1 tablet (10  mg total) by mouth at bedtime. 10/27/18   Bing Neighbors, FNP  traZODone (DESYREL) 50 MG tablet Take 1-2 tablets (50-100 mg total) by mouth at bedtime as needed for sleep. 10/27/18   Bing Neighbors, FNP    Allergies    Patient has no known allergies.  Review of Systems   Review of Systems  Constitutional: Negative for activity change, appetite change, fatigue and fever.  HENT: Positive for congestion and nosebleeds. Negative for sinus pain.   Eyes: Negative for visual disturbance.  Respiratory: Positive for chest tightness and shortness of breath.   Cardiovascular: Positive for chest pain.  Gastrointestinal: Negative for abdominal pain, nausea and vomiting.  Genitourinary: Negative for dysuria and hematuria.  Musculoskeletal: Negative for arthralgias and myalgias.  Skin: Negative for wound.  Neurological: Positive for weakness. Negative for dizziness and headaches.    all other systems are negative  except as noted in the HPI and PMH.   Physical Exam Updated Vital Signs BP (!) 139/92   Pulse 66   Temp 97.7 F (36.5 C) (Oral)   Resp 16   SpO2 99%   Physical Exam Vitals and nursing note reviewed.  Constitutional:      General: She is not in acute distress.    Appearance: She is well-developed and well-nourished.  HENT:     Head: Normocephalic and atraumatic.     Nose:     Comments: No evidence of recent bleeding. No septal hematoma    Mouth/Throat:     Mouth: Oropharynx is clear and moist.     Pharynx: No oropharyngeal exudate.  Eyes:     Extraocular Movements: EOM normal.     Conjunctiva/sclera: Conjunctivae normal.     Pupils: Pupils are equal, round, and reactive to light.  Neck:     Comments: No meningismus. Cardiovascular:     Rate and Rhythm: Normal rate and regular rhythm.     Pulses: Intact distal pulses.     Heart sounds: Normal heart sounds. No murmur heard.   Pulmonary:     Effort: Pulmonary effort is normal. No respiratory distress.     Breath sounds: Normal breath sounds.     Comments: TTP R chest with palpation. Chest:     Chest wall: Tenderness present.  Abdominal:     Palpations: Abdomen is soft.     Tenderness: There is no abdominal tenderness. There is no guarding or rebound.  Musculoskeletal:        General: No tenderness or edema. Normal range of motion.     Cervical back: Normal range of motion and neck supple.  Skin:    General: Skin is warm.     Capillary Refill: Capillary refill takes less than 2 seconds.  Neurological:     General: No focal deficit present.     Mental Status: She is alert and oriented to person, place, and time. Mental status is at baseline.     Cranial Nerves: No cranial nerve deficit.     Motor: No abnormal muscle tone.     Coordination: Coordination normal.     Comments: CN 2-12 intact, no ataxia on finger to nose, no nystagmus, 5/5 strength throughout, no pronator drift, Romberg negative, normal gait.    Psychiatric:        Mood and Affect: Mood and affect normal.        Behavior: Behavior normal.     ED Results / Procedures / Treatments   Labs (all labs ordered are listed, but only  abnormal results are displayed) Labs Reviewed  CBC WITH DIFFERENTIAL/PLATELET  BASIC METABOLIC PANEL  I-STAT BETA HCG BLOOD, ED ([facility], [facility], [facility] ONLY)  TROPONIN I (HIGH SENSITIVITY)    EKG None  Radiology No results found.  Procedures Procedures (including critical care time)  Medications Ordered in ED Medications - No data to display  ED Course  I have reviewed the triage vital signs and the nursing notes.  Pertinent labs & imaging results that were available during my care of the patient were reviewed by me and considered in my medical decision making (see chart for details).    MDM Rules/Calculators/A&P                         Episode of bilateral facial tingling, nosebleed, chest pressure, and hot flashes.  She feels improved at this time.  No evidence of recent bleeding in nose.  Neurological exam is intact.  Plan for EKG, labs with single troponin, and chest x-ray. Her description of chest pain is atypical for ACS or PE.  She denies possibility of pregnancy but we will check.  Shortly after initial evaluation, patient told nursing staff that she could not stay and was leaving. She refused EKG, labs, and chest xray. She left the department before I could speak with her. Patient left the ED AMA under her own power.    Final Clinical Impression(s) / ED Diagnoses Final diagnoses:  None    Rx / DC Orders ED Discharge Orders    None       Corin Tilly, Jeannett Senior, MD 06/23/20 1761    Glynn Octave, MD 06/23/20 0745

## 2020-06-23 NOTE — ED Triage Notes (Signed)
Pt also states that chest feels "crazy" & that she does not feel like herself

## 2020-06-23 NOTE — ED Notes (Signed)
Patient states her ride is waiting on her and she can't wait she has to leave to get her children.

## 2020-06-23 NOTE — ED Triage Notes (Signed)
Pt here for eval of epistaxis & hypertension, no hx of either. States she was resting when a clot came out of her nose & nose started bleeding. No bleeding noted in triage, pt sounds congested. EMS called for same, did not feel need to transfer to facility. Pt states since epistaxis episode she's felt "weird."

## 2020-08-24 IMAGING — CT CT ANGIO CHEST
2 of 7 series · 18 of 46 positions shown · IV contrast (APPLIED)
Comparison: Chest radiograph earlier this day.

CLINICAL DATA: PE suspected, intermediate prob, positive D-dimer.
Chest pain and shortness of breath.

EXAM:
CT ANGIOGRAPHY CHEST WITH CONTRAST
TECHNIQUE: Multidetector CT imaging of the chest was performed using the
standard protocol during bolus administration of intravenous
contrast. Multiplanar CT image reconstructions and MIPs were
obtained to evaluate the vascular anatomy.
CONTRAST:  75mL 2DUSO3-GUS IOPAMIDOL (2DUSO3-GUS) INJECTION 76%

[Series 8: thins · axial · 0.66mm/px · z∈[-225,+14]mm · 15 of 386 slices shown]
[im 22/386  lung]
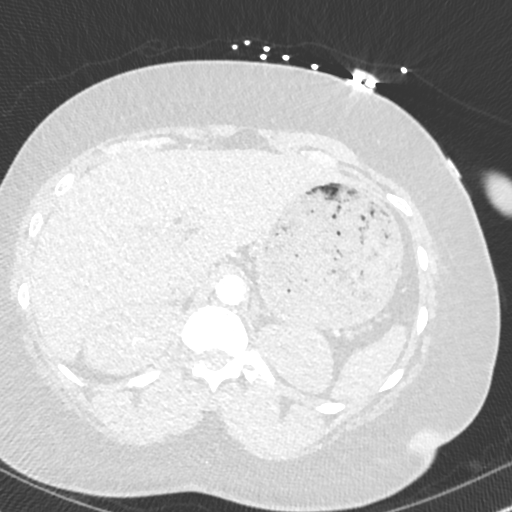
[im 43/386  soft-tissue]
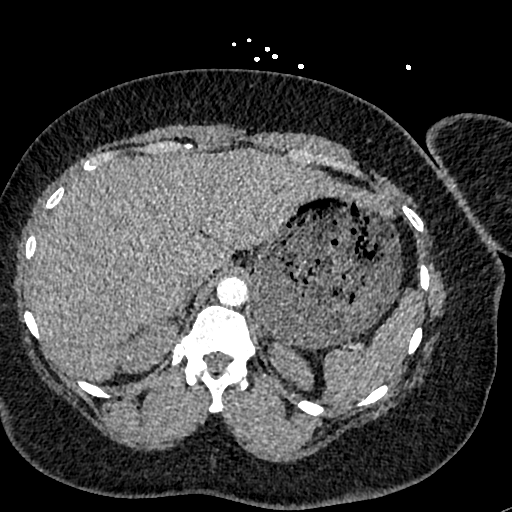
[im 65/386  lung]
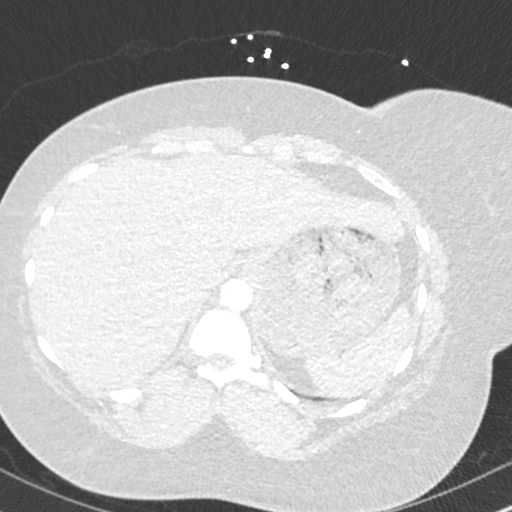
[im 86/386  soft-tissue]
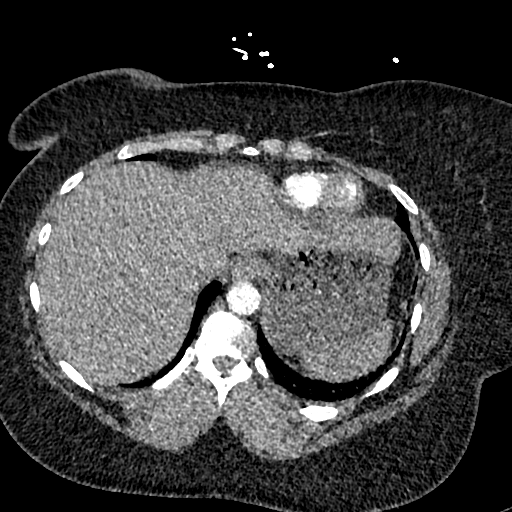
[im 129/386  lung]
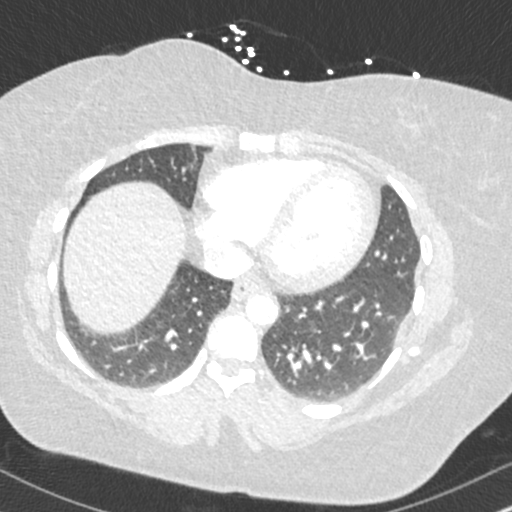
[im 150/386  soft-tissue]
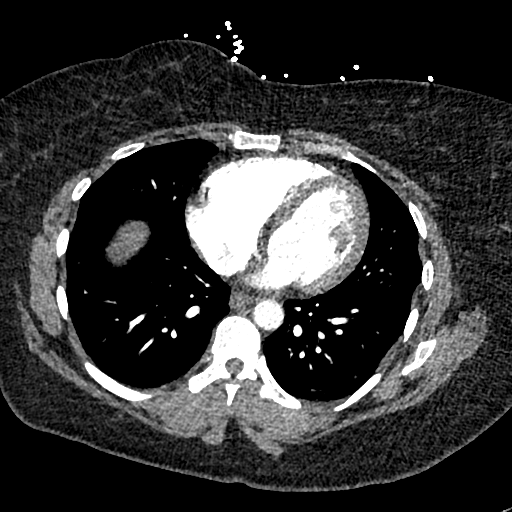
[im 172/386  lung]
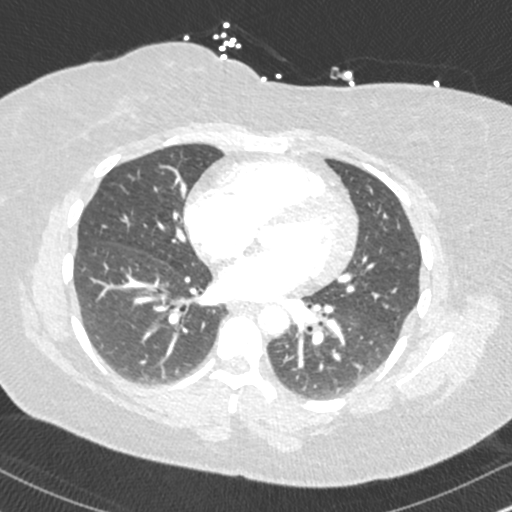
[im 193/386  soft-tissue]
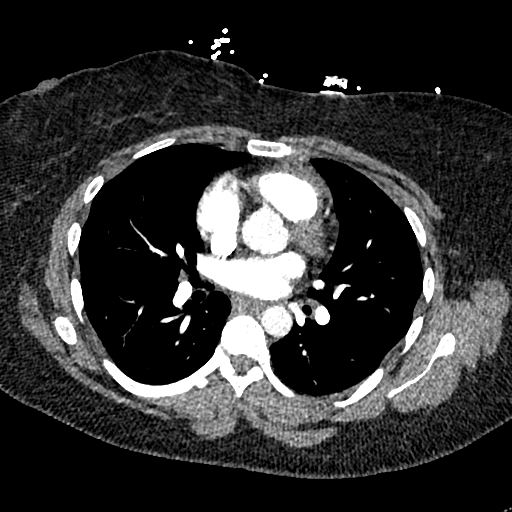
[im 214/386  lung]
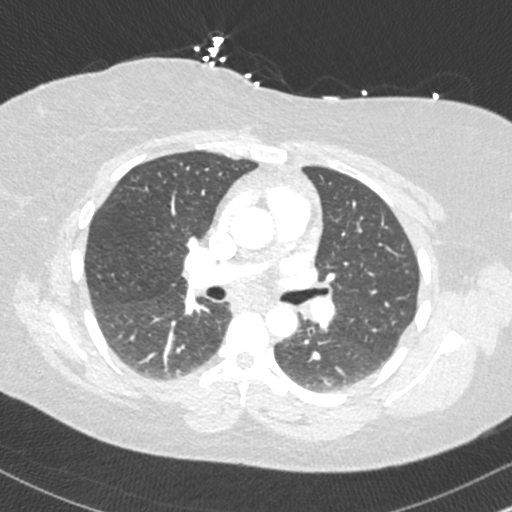
[im 236/386  soft-tissue]
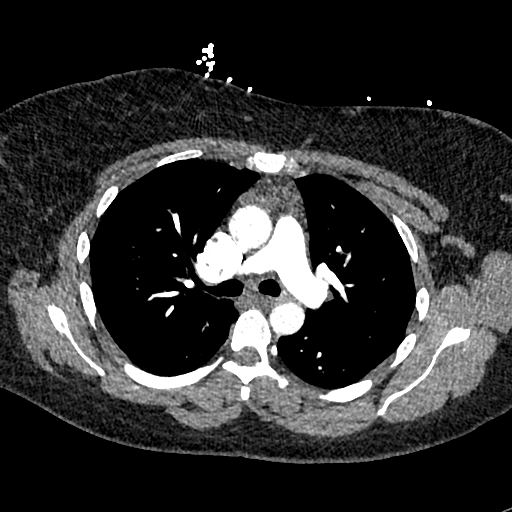
[im 257/386  lung]
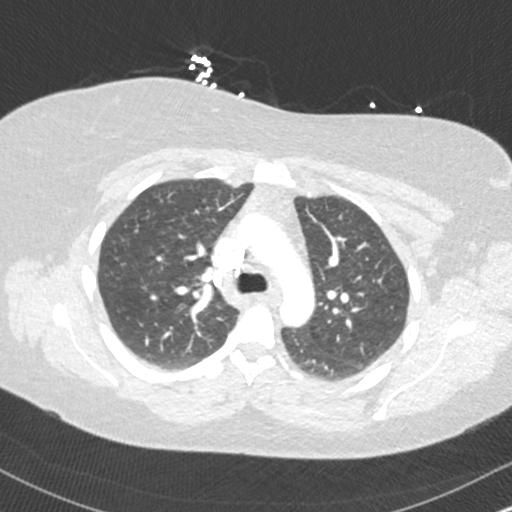
[im 300/386  soft-tissue]
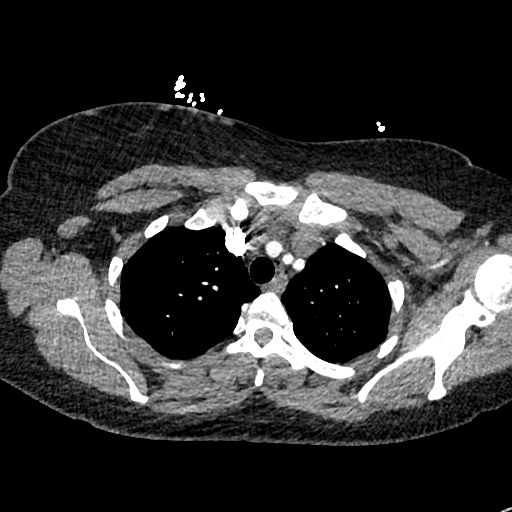
[im 321/386  lung]
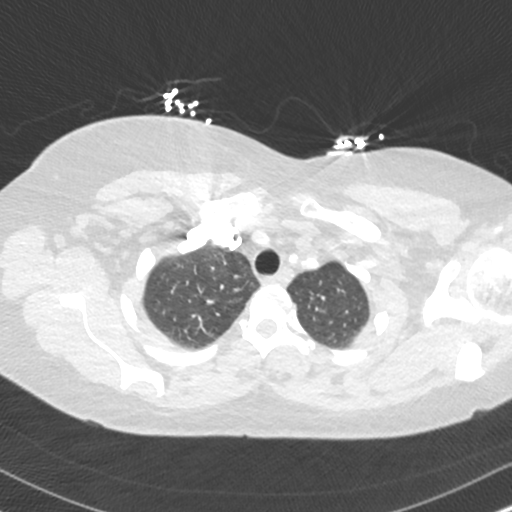
[im 343/386  soft-tissue]
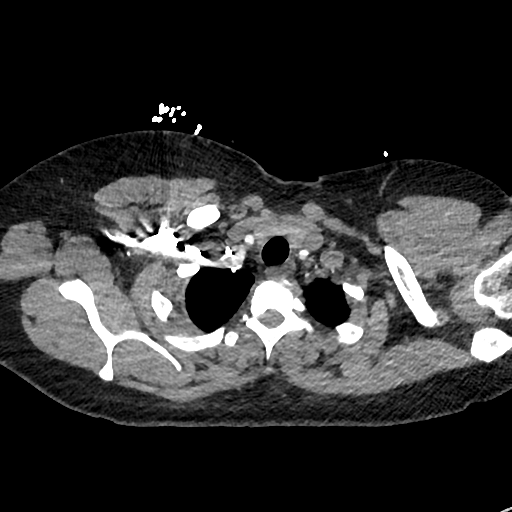
[im 364/386  lung]
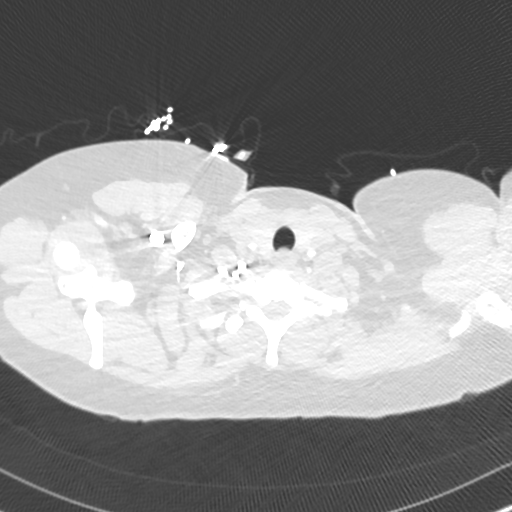

[Series 10: cor · coronal · 0.53mm/px · 3 of 121 slices shown]
[im 31/121  soft-tissue]
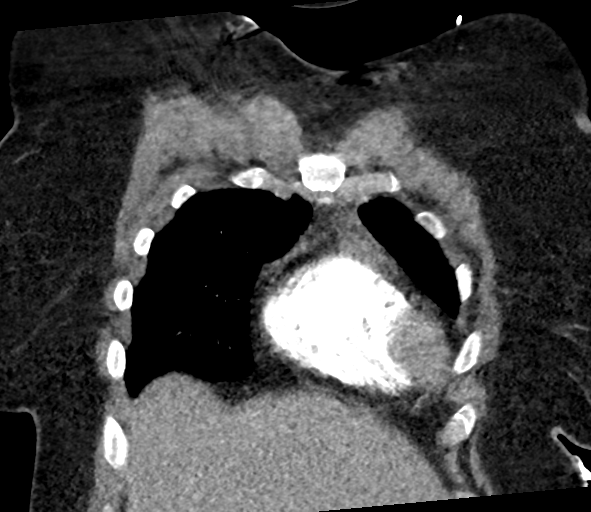
[im 61/121  soft-tissue]
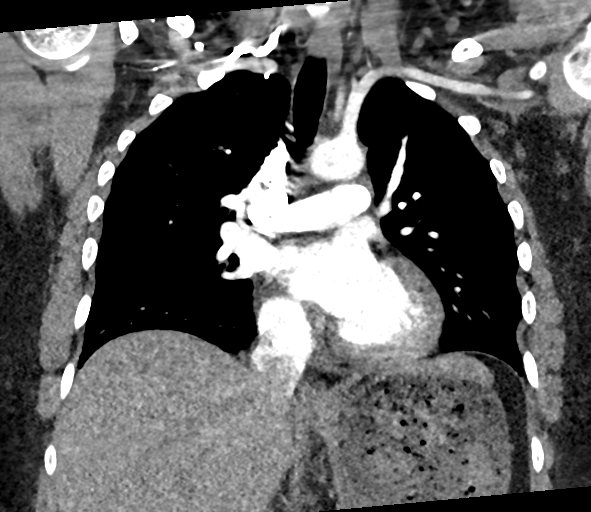
[im 91/121  soft-tissue]
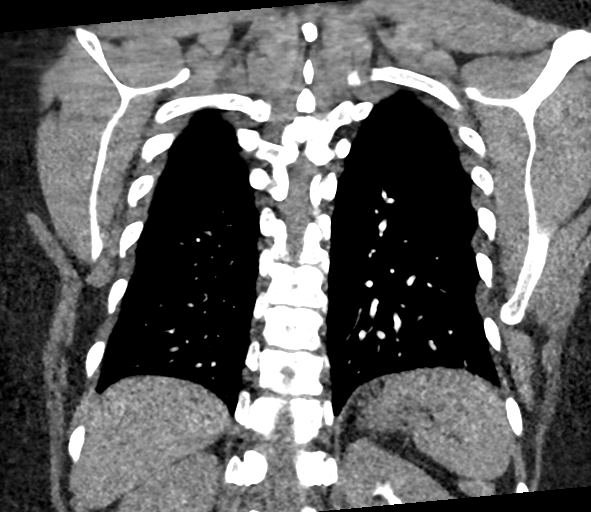

[18 of 46 positions shown; findings below may reference images not displayed]

FINDINGS: Cardiovascular: There are no filling defects within the pulmonary
arteries to suggest pulmonary embolus. Thoracic aorta is normal in
caliber without dissection. Common origin of the brachiocephalic and
left common carotid artery, normal variant anatomy. Heart is normal
in size. No pericardial effusion.

Mediastinum/Nodes: Ill-defined soft tissue density in the anterior
mediastinum without well-defined mass. No enlarged mediastinal or
hilar lymph nodes. Visualized thyroid gland is normal. The esophagus
is decompressed. Prominent left axillary and subpectoral lymph
nodes, all nodes are subcentimeter in short axis. No obvious lesion
in the included left breast. Few prominent lymph nodes in the right
axilla and subpectoral regions.

Lungs/Pleura: No focal airspace disease or pleural fluid. No
evidence of pulmonary edema. Mild central bronchial thickening. No
pulmonary mass.

Upper Abdomen: Ingested material in the stomach. No acute or
suspicious findings.

Musculoskeletal: There are no acute or suspicious osseous
abnormalities.

Review of the MIP images confirms the above findings.
IMPRESSION: 1. No pulmonary embolus.
2. Mild central bronchial thickening, can be seen with bronchitis or
asthma.
3. Ill-defined soft tissue density in the anterior mediastinum
without well-defined mass. This is likely a recurrent tear residual
thymus, but nonspecific. Consider CT follow-up in 3 months to ensure
imaging stability.
4. Prominent left greater than right axillary and subpectoral lymph
nodes, all subcentimeter in short axis. No well-defined breast
lesion on CT, however recommend nonemergent mammography. These can
also be followed up on CT as mentioned above.

## 2023-12-11 ENCOUNTER — Encounter (HOSPITAL_COMMUNITY): Payer: Self-pay | Admitting: Emergency Medicine

## 2023-12-11 ENCOUNTER — Other Ambulatory Visit: Payer: Self-pay

## 2023-12-11 ENCOUNTER — Ambulatory Visit (HOSPITAL_COMMUNITY)
Admission: EM | Admit: 2023-12-11 | Discharge: 2023-12-11 | Disposition: A | Discharge status: Home / Self Care | Payer: MEDICAID | Attending: Physician Assistant | Admitting: Physician Assistant

## 2023-12-11 DIAGNOSIS — K047 Periapical abscess without sinus: Secondary | ICD-10-CM | POA: Diagnosis not present

## 2023-12-11 DIAGNOSIS — K0889 Other specified disorders of teeth and supporting structures: Secondary | ICD-10-CM | POA: Diagnosis not present

## 2023-12-11 MED ORDER — IBUPROFEN 600 MG PO TABS: 600.0000 mg | ORAL_TABLET | Freq: Three times a day (TID) | ORAL | 0 refills | Status: AC | PRN

## 2023-12-11 MED ORDER — IBUPROFEN 800 MG PO TABS
800.0000 mg | ORAL_TABLET | Freq: Once | ORAL | Status: AC
  Administered 2023-12-11: 800 mg via ORAL

## 2023-12-11 MED ORDER — LIDOCAINE VISCOUS HCL 2 % MT SOLN: 15.0000 mL | Freq: Four times a day (QID) | OROMUCOSAL | 0 refills | Status: AC | PRN

## 2023-12-11 MED ORDER — IBUPROFEN 800 MG PO TABS
ORAL_TABLET | ORAL | Status: AC
  Filled 2023-12-11: qty 1

## 2023-12-11 MED ORDER — AMOXICILLIN-POT CLAVULANATE 875-125 MG PO TABS: 1.0000 | ORAL_TABLET | Freq: Two times a day (BID) | ORAL | 0 refills | Status: AC

## 2023-12-11 NOTE — ED Triage Notes (Signed)
 Has a dental appt July 17 Pain in right side of mouth for 5 days.  Pain is upper right teeth.  Patient has taken ibuprofen  and tylenol .

## 2023-12-11 NOTE — Discharge Instructions (Signed)
 Start Augmentin twice daily for 10 days.  Use ibuprofen  600 mg up to 3 times a day for pain relief.  You should avoid NSAIDs including aspirin, ibuprofen /Advil , naproxen/Aleve with this medication as it causes stomach bleeding.  You can use Tylenol  for breakthrough pain.  Gargle with warm salt water for additional symptom relief.  Use viscous lidocaine  up to 4 times a day for additional pain relief.  Do not eat or drink immediately after using this medication as it causes an increased risk of choking.  Follow-up with your dentist.  If you develop any worsening symptoms including difficulty swallowing, difficulty speaking, swelling of your throat, high fever, change in your voice you need to be seen immediately.

## 2023-12-11 NOTE — ED Provider Notes (Signed)
 [facility]    CSN: [id number] Arrival date & time: 12/11/23  1516      History   Chief Complaint Chief Complaint  Patient presents with   Dental Pain    HPI Jaylianna ALMAROSA BOHAC is a 48 y.o. female.   Patient presents today with a 5-day history of right upper mouth pain and swelling.  She reports that currently pain is rated 7 on a 0-10 pain scale, described as throbbing, localized to right upper premolar and first molar, no aggravating alleviating factors identified.  She has been taking ibuprofen  without improvement of symptoms.  She has found that warm compresses provides some pain relief.  She has not seen a dentist recently but does have an appointment scheduled for 12/25/2023.  Denies any recent dental procedure including extraction or deep cleaning.  She denies any swelling of her throat, shortness of breath, muffled voice.  She has no concern for pregnancy.  Denies any recent antibiotics.  She is eating and drinking normally despite the symptoms.    Past Medical History:  Diagnosis Date   No pertinent past medical history     There are no active problems to display for this patient.   Past Surgical History:  Procedure Laterality Date   CLOSED REDUCTION ULNAR SHAFT      OB History     Gravida  10   Para  8   Term  5   Preterm  3   AB  2   Living  7      SAB  1   IAB  1   Ectopic  0   Multiple  1   Live Births  3            Home Medications    Prior to Admission medications   Medication Sig Start Date End Date Taking? Authorizing Provider  amoxicillin-clavulanate (AUGMENTIN) 875-125 MG tablet Take 1 tablet by mouth every 12 (twelve) hours. 12/11/23  Yes Delcenia Inman K, PA-C  ibuprofen  (ADVIL ) 600 MG tablet Take 1 tablet (600 mg total) by mouth every 8 (eight) hours as needed. 12/11/23  Yes Kaede Clendenen K, PA-C  lidocaine  (XYLOCAINE ) 2 % solution Use as directed 15 mLs in the mouth or throat every 6 (six) hours as needed for mouth pain.  12/11/23  Yes Prudy Candy, Rocky POUR, PA-C    Family History Family History  Problem Relation Age of Onset   Diabetes Maternal Grandmother    Hyperlipidemia Maternal Grandmother    Hypertension Maternal Grandmother    Heart disease Maternal Grandmother    Asthma Daughter    Stroke Neg Hx     Social History Social History   Tobacco Use   Smoking status: Every Day    Current packs/day: 1.00    Types: Cigarettes   Smokeless tobacco: Never  Vaping Use   Vaping status: Never Used  Substance Use Topics   Alcohol use: Yes    Comment: rarely wine   Drug use: Not Currently    Types: Marijuana     Allergies   Patient has no known allergies.   Review of Systems Review of Systems  Constitutional:  Positive for activity change. Negative for appetite change, fatigue and fever.  HENT:  Positive for dental problem. Negative for sinus pressure, sneezing, sore throat, trouble swallowing and voice change.   Gastrointestinal:  Negative for abdominal pain, diarrhea, nausea and vomiting.     Physical Exam Triage Vital Signs ED Triage Vitals  Encounter Vitals Group  BP 12/11/23 1616 (!) 141/92     Girls Systolic BP Percentile --      Girls Diastolic BP Percentile --      Boys Systolic BP Percentile --      Boys Diastolic BP Percentile --      Pulse Rate 12/11/23 1616 75     Resp 12/11/23 1616 (!) 22     Temp 12/11/23 1616 98.4 F (36.9 C)     Temp Source 12/11/23 1616 Oral     SpO2 12/11/23 1616 100 %     Weight --      Height --      Head Circumference --      Peak Flow --      Pain Score 12/11/23 1613 8     Pain Loc --      Pain Education --      Exclude from Growth Chart --    No data found.  Updated Vital Signs BP (!) 141/92 (BP Location: Right Arm)   Pulse 75   Temp 98.4 F (36.9 C) (Oral)   Resp (!) 22   LMP 11/10/2023   SpO2 100%   Visual Acuity Right Eye Distance:   Left Eye Distance:   Bilateral Distance:    Right Eye Near:   Left Eye Near:     Bilateral Near:     Physical Exam Vitals reviewed.  Constitutional:      General: She is awake. She is not in acute distress.    Appearance: Normal appearance. She is well-developed. She is not ill-appearing.     Comments: Very pleasant female appears stated age in no acute distress sitting comfortably in exam room  HENT:     Head: Normocephalic and atraumatic.     Right Ear: Tympanic membrane, ear canal and external ear normal. Tympanic membrane is not erythematous or bulging.     Left Ear: Tympanic membrane, ear canal and external ear normal. Tympanic membrane is not erythematous or bulging.     Mouth/Throat:     Dentition: Abnormal dentition. Dental tenderness and gingival swelling present.     Pharynx: Uvula midline. No oropharyngeal exudate or posterior oropharyngeal erythema.      Comments: No evidence of Ludwig angina. Cardiovascular:     Rate and Rhythm: Normal rate and regular rhythm.     Heart sounds: Normal heart sounds, S1 normal and S2 normal. No murmur heard. Pulmonary:     Effort: Pulmonary effort is normal.     Breath sounds: Normal breath sounds. No wheezing, rhonchi or rales.     Comments: Clear to auscultation bilaterally Psychiatric:        Behavior: Behavior is cooperative.      UC Treatments / Results  Labs (all labs ordered are listed, but only abnormal results are displayed) Labs Reviewed - No data to display  EKG   Radiology No results found.  Procedures Procedures (including critical care time)  Medications Ordered in UC Medications  ibuprofen  (ADVIL ) tablet 800 mg (has no administration in time range)    Initial Impression / Assessment and Plan / UC Course  I have reviewed the triage vital signs and the nursing notes.  Pertinent labs & imaging results that were available during my care of the patient were reviewed by me and considered in my medical decision making (see chart for details).     Patient is well-appearing, afebrile,  nontoxic, nontachycardic.  No indication for emergent evaluation or imaging.  Patient was treated for dental  infection with Augmentin twice daily for 10 days.  She was given a dose of ibuprofen  800 mg for pain relief in clinic and sent home with a prescription for ibuprofen  600 mg for pain relief with instruction to take this with food to prevent GI upset.  She is to avoid use of additional NSAIDs.  Can use Tylenol  for breakthrough pain.  Recommend she gargle with warm salt water for additional symptom relief.  She was also given prescription for viscous lidocaine  and we discussed that she can gargle and spit this out up to 4 times a day as needed.  She is not to eat or drink immediately after using this medication as it increases the risk of choking.  Discussed that ultimately she will need to see dentist to address underlying tooth.  She was provided low-cost dental resources in the area with after visit summary.  If she develops any worsening symptoms including fever, nausea, vomiting, swelling of her throat, muffled voice, dysphagia she needs to go to the emergency room to which she expressed understanding.  Work excuse note declined.   Final Clinical Impressions(s) / UC Diagnoses   Final diagnoses:  Dental infection  Dentalgia     Discharge Instructions      Start Augmentin twice daily for 10 days.  Use ibuprofen  600 mg up to 3 times a day for pain relief.  You should avoid NSAIDs including aspirin, ibuprofen /Advil , naproxen/Aleve with this medication as it causes stomach bleeding.  You can use Tylenol  for breakthrough pain.  Gargle with warm salt water for additional symptom relief.  Use viscous lidocaine  up to 4 times a day for additional pain relief.  Do not eat or drink immediately after using this medication as it causes an increased risk of choking.  Follow-up with your dentist.  If you develop any worsening symptoms including difficulty swallowing, difficulty speaking, swelling of your  throat, high fever, change in your voice you need to be seen immediately.      ED Prescriptions     Medication Sig Dispense Auth. Provider   amoxicillin-clavulanate (AUGMENTIN) 875-125 MG tablet Take 1 tablet by mouth every 12 (twelve) hours. 20 tablet Quentyn Kolbeck K, PA-C   ibuprofen  (ADVIL ) 600 MG tablet Take 1 tablet (600 mg total) by mouth every 8 (eight) hours as needed. 30 tablet Gayna Braddy K, PA-C   lidocaine  (XYLOCAINE ) 2 % solution Use as directed 15 mLs in the mouth or throat every 6 (six) hours as needed for mouth pain. 100 mL Keianna Signer K, PA-C      PDMP not reviewed this encounter.   Sherrell Rocky POUR, PA-C 12/11/23 1736
